# Patient Record
Sex: Female | Born: 1970 | Race: White | Hispanic: No | Marital: Married
Health system: Southern US, Community
[De-identification: ages and names within clinical notes are randomized; demographics above are authoritative.]

## PROBLEM LIST (undated history)

## (undated) DIAGNOSIS — E039 Hypothyroidism, unspecified: Secondary | ICD-10-CM

## (undated) DIAGNOSIS — G43909 Migraine, unspecified, not intractable, without status migrainosus: Secondary | ICD-10-CM

## (undated) DIAGNOSIS — R51 Headache: Secondary | ICD-10-CM

## (undated) DIAGNOSIS — D219 Benign neoplasm of connective and other soft tissue, unspecified: Secondary | ICD-10-CM

## (undated) DIAGNOSIS — L900 Lichen sclerosus et atrophicus: Secondary | ICD-10-CM

## (undated) DIAGNOSIS — Z8669 Personal history of other diseases of the nervous system and sense organs: Secondary | ICD-10-CM

## (undated) DIAGNOSIS — N938 Other specified abnormal uterine and vaginal bleeding: Secondary | ICD-10-CM

## (undated) DIAGNOSIS — N200 Calculus of kidney: Secondary | ICD-10-CM

## (undated) DIAGNOSIS — K589 Irritable bowel syndrome without diarrhea: Secondary | ICD-10-CM

## (undated) DIAGNOSIS — K219 Gastro-esophageal reflux disease without esophagitis: Secondary | ICD-10-CM

## (undated) DIAGNOSIS — N939 Abnormal uterine and vaginal bleeding, unspecified: Secondary | ICD-10-CM

## (undated) HISTORY — PX: LITHOTRIPSY: SUR834

## (undated) HISTORY — PX: CHOLECYSTECTOMY: SHX55

## (undated) HISTORY — PX: ABDOMINAL HYSTERECTOMY: SHX81

---

## 1999-07-12 ENCOUNTER — Other Ambulatory Visit: Admission: RE | Admit: 1999-07-12 | Discharge: 1999-07-12 | Payer: Self-pay | Admitting: Gynecology

## 1999-07-18 ENCOUNTER — Ambulatory Visit (HOSPITAL_COMMUNITY): Admission: RE | Admit: 1999-07-18 | Discharge: 1999-07-18 | Payer: Self-pay | Admitting: Gastroenterology

## 1999-07-18 ENCOUNTER — Encounter: Payer: Self-pay | Admitting: Gastroenterology

## 1999-07-25 ENCOUNTER — Encounter (INDEPENDENT_AMBULATORY_CARE_PROVIDER_SITE_OTHER): Payer: Self-pay | Admitting: *Deleted

## 1999-07-25 ENCOUNTER — Ambulatory Visit (HOSPITAL_COMMUNITY): Admission: RE | Admit: 1999-07-25 | Discharge: 1999-07-26 | Payer: Self-pay

## 2006-01-28 DIAGNOSIS — Z8669 Personal history of other diseases of the nervous system and sense organs: Secondary | ICD-10-CM

## 2006-01-28 HISTORY — DX: Personal history of other diseases of the nervous system and sense organs: Z86.69

## 2009-03-29 ENCOUNTER — Ambulatory Visit (HOSPITAL_BASED_OUTPATIENT_CLINIC_OR_DEPARTMENT_OTHER): Admission: RE | Admit: 2009-03-29 | Discharge: 2009-03-29 | Payer: Self-pay | Admitting: Unknown Physician Specialty

## 2009-03-29 ENCOUNTER — Ambulatory Visit: Payer: Self-pay | Admitting: Diagnostic Radiology

## 2009-10-06 ENCOUNTER — Emergency Department (HOSPITAL_BASED_OUTPATIENT_CLINIC_OR_DEPARTMENT_OTHER): Admission: EM | Admit: 2009-10-06 | Discharge: 2009-10-06 | Payer: Self-pay | Admitting: Emergency Medicine

## 2010-04-12 LAB — HIV RAPID SCREEN (BLD OR BODY FLD EXPOSURE): Rapid HIV Screen: NONREACTIVE

## 2010-04-12 LAB — HEPATITIS B SURFACE ANTIGEN: Hepatitis B Surface Ag: NEGATIVE

## 2010-04-12 LAB — HEPATITIS C ANTIBODY (REFLEX): HCV Ab: NEGATIVE

## 2010-05-07 ENCOUNTER — Other Ambulatory Visit: Payer: Self-pay | Admitting: Gynecology

## 2010-05-07 DIAGNOSIS — R922 Inconclusive mammogram: Secondary | ICD-10-CM

## 2010-05-09 ENCOUNTER — Ambulatory Visit
Admission: RE | Admit: 2010-05-09 | Discharge: 2010-05-09 | Disposition: A | Discharge status: Home / Self Care | Payer: Self-pay | Source: Ambulatory Visit | Attending: Gynecology | Admitting: Gynecology

## 2010-05-09 ENCOUNTER — Ambulatory Visit
Admission: RE | Admit: 2010-05-09 | Discharge: 2010-05-09 | Disposition: A | Discharge status: Home / Self Care | Payer: 59 | Source: Ambulatory Visit | Attending: Gynecology | Admitting: Gynecology

## 2010-05-09 DIAGNOSIS — R922 Inconclusive mammogram: Secondary | ICD-10-CM

## 2010-06-15 NOTE — Op Note (Signed)
[place]  Patient:    Rita Riley, Rita Riley                    MRN: [id number] Proc. Date: 07/25/99 Adm. Date:  [id number] Disc. Date: [id number] Attending:  Meredith Leeds CC:         Everardo All. Madilyn Fireman, M.D.                           Operative Report  PREOPERATIVE DIAGNOSES: 1. Cholelithiasis. 2. Chronic cystitis.  POSTOPERATIVE DIAGNOSES: 1. Cholelithiasis. 2. Chronic cystitis.  OPERATIONS: 1. Laparoscopic cholecystectomy. 2. Operative cholangiogram.  SURGEON:  Zigmund Daniel, M.D.  ASSISTANT:  ANESTHESIA:  General.  INDICATIONS:  The patient is a 40 year old white female with recent onset of episodic and frequent upper abdominal pain radiating to the back.  A gallbladder ultrasound had shown sludge and possibly small stones.  The common bile duct was found to be normal.  The endoscopy had shown some evidence of hyperacidity, but the patient had not improved on acid-reducing medication.  A liver test done on the day of surgery showed a slightly elevated total bilirubin of 1.6, an SGOT of 66, and an SGPT of 103.  The alkaline phosphatase was normal.  DESCRIPTION OF PROCEDURE:  After adequate anesthesia and routine preparation and draping of the abdomen, I made a short infraumbilical incision, dissected down to the fascia, opened it longitudinally, opened the peritoneum bluntly, and placed an 0 Vicryl pursestring suture.  I then put in a Hasson cannula and inflated the abdomen with CO2.  A brief laparoscopy disclosed no abnormalities.  There were just a few adhesions of the duodenum and omentum to the undersurface of the gallbladder.  The liver had a normal appearance. After placement of three additional ports and position of the patient head up, foot down, and tilted to the left, I retracted the fundus of the gallbladder upward and the infundibulum laterally and took down the adhesions.  I found the cystic artery and clipped it  with free clips and cut between the two closer to the gallbladder.  I placed a clip on the side of the cystic duct emerging from the infundibulum of the gallbladder.  I then made a small hole in the cystic duct and put in a Reddick cholangiogram catheter and secured that with a clip and performed a fluoroscopic cholangiogram.  The flow was rapid and went into the duodenum without evident obstruction or filling defect.  Despite injection under quite a bit of force, I could not totally fill out the intrahepatic radicles, but they were normal in size.  The common bile duct appeared at the upper limit of normal in size.  I reviewed the films and found no evidence of any filling defects and did not feel that any exploration of the duct was warranted.  I then put the camera back in, removed the cholangiogram catheter, and clipped the distal cystic duct with three clips and divided it at the site of the existing hole.  I then dissected the gallbladder from the liver using the cautery, getting hemostasis with clips and cautery as necessary.  I removed it intact from the liver, got good hemostasis in the gallbladder fossa, briefly irrigated the area to remove the small amount of bile and blood which was present, and removed the irrigant until it was clear.  I removed the gallbladder from the abdomen through the umbilical incision and tied  the pursestring suture.  I opened the gallbladder and found a couple of tiny stones and a lot of very thick bile.  No other abnormalities were evident.  I anesthetized all of the incisions with 0.5% Marcaine with epinephrine, removed the CO2 and all of the ports, and then closed the skin with intracuticular 4-0 Vicryl and Steri-Strips.  She tolerated the operation well. DD:  07/25/99 TD:  07/26/99 Job: [id number]

## 2010-06-15 NOTE — Op Note (Signed)
[facility]  Patient:    Rita Riley, Rita Riley                    MRN: [id number] Proc. Date: 07/18/99 Adm. Date:  [id number] Disc. Date: [id number] Attending:  Louie Bun CC:         Zigmund Daniel, M.D.                           Operative Report  PROCEDURE:  Esophagogastroduodenoscopy.  INDICATION FOR PROCEDURE:  Abdominal pain and diarrhea with equivocal findings on gallbladder ultrasound today of sludge and a possible dilated gallbladder. Procedure is to assess for significant likelihood of acid peptic disease contributing to her upper tract symptoms as opposed to biliary colic.  DESCRIPTION OF PROCEDURE:  The patient was placed in the left lateral decubitus position then placed on the pulse monitor with continue low flow oxygen delivered by nasal cannula. No sedation was used for this procedure in that we could not get easy IV access. After spraying with cetacaine, the Olympus video endoscope was advanced under direct vision into the oropharynx and esophagus. The esophagus was straight and of normal caliber at the squamocolumnar line at 37 cm above a 2 cm hiatal hernia. There was no visible esophagitis, ring, stricture or other abnormality of the gastroesophageal junction. The stomach was entered and a small amount of liquid secretions were suctioned from the fundus. Retroflexed view of the cardia was essentially unremarkable and did not clearly reveal the hiatal hernia. The fundus, body, antrum and pylorus all appeared normal. The duodenum was entered and both the bulb and second portion were well inspected and there appeared to be some erythema and granularity mainly of the duodenal bulb consistent with duodenitis but no ulcers seen. The scope was withdrawn back into the stomach and a CLOtest obtained. The scope was then withdrawn and the patient returned to the recovery room in stable condition. The patient tolerated the procedure well and  there were no immediate complications.  IMPRESSION: 1. A 2 cm hiatal hernia. 2. Duodenitis.  PLAN:  Will continue double dose Prevacid and the patient has an appointment with Dr. Lebron Conners for consideration of cholecystectomy. DD:  07/18/99 TD:  07/20/99 Job: [id number]

## 2011-03-16 ENCOUNTER — Ambulatory Visit (HOSPITAL_BASED_OUTPATIENT_CLINIC_OR_DEPARTMENT_OTHER)
Admission: RE | Admit: 2011-03-16 | Discharge: 2011-03-16 | Disposition: A | Discharge status: Home / Self Care | Payer: Managed Care, Other (non HMO) | Source: Ambulatory Visit | Attending: Family Medicine | Admitting: Family Medicine

## 2011-03-16 ENCOUNTER — Ambulatory Visit (INDEPENDENT_AMBULATORY_CARE_PROVIDER_SITE_OTHER)
Admission: RE | Admit: 2011-03-16 | Discharge: 2011-03-16 | Disposition: A | Discharge status: Home / Self Care | Payer: Managed Care, Other (non HMO) | Source: Ambulatory Visit | Attending: Family Medicine | Admitting: Family Medicine

## 2011-03-16 ENCOUNTER — Other Ambulatory Visit (HOSPITAL_BASED_OUTPATIENT_CLINIC_OR_DEPARTMENT_OTHER): Payer: Self-pay | Admitting: Family Medicine

## 2011-03-16 DIAGNOSIS — S82899A Other fracture of unspecified lower leg, initial encounter for closed fracture: Secondary | ICD-10-CM | POA: Insufficient documentation

## 2011-03-16 DIAGNOSIS — X58XXXA Exposure to other specified factors, initial encounter: Secondary | ICD-10-CM | POA: Insufficient documentation

## 2011-03-16 DIAGNOSIS — W19XXXA Unspecified fall, initial encounter: Secondary | ICD-10-CM

## 2011-03-16 DIAGNOSIS — M25579 Pain in unspecified ankle and joints of unspecified foot: Secondary | ICD-10-CM | POA: Insufficient documentation

## 2011-03-16 DIAGNOSIS — S8263XA Displaced fracture of lateral malleolus of unspecified fibula, initial encounter for closed fracture: Secondary | ICD-10-CM

## 2011-07-30 ENCOUNTER — Other Ambulatory Visit: Payer: Self-pay | Admitting: Gynecology

## 2012-07-23 ENCOUNTER — Other Ambulatory Visit: Payer: Self-pay

## 2012-07-23 DIAGNOSIS — Z1231 Encounter for screening mammogram for malignant neoplasm of breast: Secondary | ICD-10-CM

## 2012-08-26 ENCOUNTER — Ambulatory Visit
Admission: RE | Admit: 2012-08-26 | Discharge: 2012-08-26 | Disposition: A | Discharge status: Home / Self Care | Payer: Managed Care, Other (non HMO) | Source: Ambulatory Visit

## 2012-08-26 DIAGNOSIS — Z1231 Encounter for screening mammogram for malignant neoplasm of breast: Secondary | ICD-10-CM

## 2013-01-28 HISTORY — PX: HYSTERECTOMY ABDOMINAL WITH SALPINGECTOMY: SHX6725

## 2013-08-16 ENCOUNTER — Encounter (HOSPITAL_COMMUNITY): Payer: Self-pay | Admitting: Pharmacy Technician

## 2013-08-24 ENCOUNTER — Other Ambulatory Visit (HOSPITAL_COMMUNITY): Payer: Managed Care, Other (non HMO)

## 2013-08-24 ENCOUNTER — Encounter (HOSPITAL_COMMUNITY): Payer: Self-pay

## 2013-08-24 ENCOUNTER — Encounter (HOSPITAL_COMMUNITY)
Admission: RE | Admit: 2013-08-24 | Discharge: 2013-08-24 | Disposition: A | Discharge status: Home / Self Care | Payer: Managed Care, Other (non HMO) | Source: Ambulatory Visit | Attending: Obstetrics and Gynecology | Admitting: Obstetrics and Gynecology

## 2013-08-24 DIAGNOSIS — Z01812 Encounter for preprocedural laboratory examination: Secondary | ICD-10-CM | POA: Insufficient documentation

## 2013-08-24 DIAGNOSIS — Z01818 Encounter for other preprocedural examination: Secondary | ICD-10-CM | POA: Insufficient documentation

## 2013-08-24 HISTORY — DX: Calculus of kidney: N20.0

## 2013-08-24 HISTORY — DX: Headache: R51

## 2013-08-24 HISTORY — DX: Irritable bowel syndrome without diarrhea: K58.9

## 2013-08-24 HISTORY — DX: Gastro-esophageal reflux disease without esophagitis: K21.9

## 2013-08-24 HISTORY — DX: Hypothyroidism, unspecified: E03.9

## 2013-08-24 LAB — CBC
HCT: 43.3 % (ref 36.0–46.0)
Hemoglobin: 14.5 g/dL (ref 12.0–15.0)
MCH: 32.2 pg (ref 26.0–34.0)
MCHC: 33.5 g/dL (ref 30.0–36.0)
MCV: 96 fL (ref 78.0–100.0)
PLATELETS: 353 10*3/uL (ref 150–400)
RBC: 4.51 MIL/uL (ref 3.87–5.11)
RDW: 12.5 % (ref 11.5–15.5)
WBC: 11.5 10*3/uL — ABNORMAL HIGH (ref 4.0–10.5)

## 2013-08-24 NOTE — Patient Instructions (Signed)
[place]  08/24/2013   Your procedure is scheduled on:  09/01/13  Enter through the Main Entrance of [facility] [place] up the phone at the desk and dial [phone].   Call this number if you have problems the morning of surgery: [phone]   Remember:   Do not eat food:After Midnight.  Do not drink clear liquids: After Midnight.  Take these medicines the morning of surgery with A SIP OF WATER: NA   Do not wear jewelry, make-up or nail polish.  Do not wear lotions, powders, or perfumes. You may wear deodorant.  Do not shave 48 hours prior to surgery.  Do not bring valuables to the hospital.  [facility] is not   responsible for any belongings or valuables brought to the hospital.  Contacts, dentures or bridgework may not be worn into surgery.  Leave suitcase in the car. After surgery it may be brought to your room.  For patients admitted to the hospital, checkout time is 11:00 AM the day of              discharge.   Patients discharged the day of surgery will not be allowed to drive             home.  Name and phone number of your driver: NA  Special Instructions:      Please read over the following fact sheets that you were given:   Surgical Site Infection Prevention

## 2013-08-31 ENCOUNTER — Encounter (HOSPITAL_COMMUNITY): Payer: Self-pay | Admitting: Obstetrics and Gynecology

## 2013-08-31 DIAGNOSIS — D219 Benign neoplasm of connective and other soft tissue, unspecified: Secondary | ICD-10-CM

## 2013-08-31 DIAGNOSIS — N939 Abnormal uterine and vaginal bleeding, unspecified: Secondary | ICD-10-CM | POA: Diagnosis present

## 2013-08-31 DIAGNOSIS — N938 Other specified abnormal uterine and vaginal bleeding: Secondary | ICD-10-CM

## 2013-08-31 HISTORY — DX: Abnormal uterine and vaginal bleeding, unspecified: N93.9

## 2013-08-31 HISTORY — DX: Benign neoplasm of connective and other soft tissue, unspecified: D21.9

## 2013-08-31 HISTORY — DX: Other specified abnormal uterine and vaginal bleeding: N93.8

## 2013-08-31 MED ORDER — DEXTROSE 5 % IV SOLN
3.0000 g | INTRAVENOUS | Status: AC
  Administered 2013-09-01: 3 g via INTRAVENOUS
  Filled 2013-08-31: qty 3000

## 2013-08-31 NOTE — H&P (Signed)
Rita Riley is an 43 y.o. female G0P0 with AUB, DUB and fibroids for LAVH, B salpingectomy.  D/W pt r/b/a of of surgery.  EMB benign.  US shows small fibroids which may impinge of endometrial lining.    Pertinent Gynecological History: Sexually transmitted diseases:none No abn pap OB History: G0, P0   Menstrual History:  No LMP recorded.    Past Medical History  Diagnosis Date  . Hypothyroidism   . GERD (gastroesophageal reflux disease)   . Recurrent kidney stones     several surgeries  . IBS (irritable bowel syndrome)   . Headache(784.0)     takes Verapamil ONLY for migraines  . Fibroids 08/31/2013  . Abnormal uterine bleeding (AUB) 08/31/2013  . DUB (dysfunctional uterine bleeding) 08/31/2013    Past Surgical History  Procedure Laterality Date  . Lithotripsy     FH: Breast cancer, hypothyroid, Diabetes.  Social History:  has no tobacco, alcohol, and drug history on file. married  Allergies:  Allergies  Allergen Reactions  . Sulfa Antibiotics Rash    Levothyroxine, verapamil, Vitamin D, Zantac    Review of Systems  Constitutional: Negative.   HENT: Negative.   Eyes: Negative.   Respiratory: Negative.   Cardiovascular: Negative.   Gastrointestinal: Negative.   Genitourinary: Negative.   Musculoskeletal: Negative.   Skin: Negative.   Neurological: Negative.   Psychiatric/Behavioral: Negative.     There were no vitals taken for this visit. Physical Exam  Constitutional: She is oriented to person, place, and time. She appears well-developed and well-nourished.  HENT:  Head: Normocephalic and atraumatic.  Cardiovascular: Normal rate and regular rhythm.   Respiratory: Effort normal and breath sounds normal. No respiratory distress. She has no wheezes.  GI: Soft. Bowel sounds are normal. She exhibits no distension. There is no tenderness.  Musculoskeletal: Normal range of motion.  Neurological: She is alert and oriented to person, place, and time.  Skin:  Skin is warm and dry.  Psychiatric: She has a normal mood and affect. Her behavior is normal.    No results found for this or any previous visit (from the past 24 hour(s)). Ur Cx neg  Assessment/Plan: 43yo G0P0 for LAVH secondary to DUB, AUB, fibroids D/w pt r/b/a of surgery, wishes to proceed Ancef for prophylaxis PCA for pain postoperative;y  Bovard-Stuckert, Rita Riley 08/31/2013, 8:41 PM

## 2013-09-01 ENCOUNTER — Observation Stay (HOSPITAL_COMMUNITY)
Admission: RE | Admit: 2013-09-01 | Discharge: 2013-09-02 | Disposition: A | Discharge status: Home / Self Care | Payer: Managed Care, Other (non HMO) | Source: Ambulatory Visit | Attending: Obstetrics and Gynecology | Admitting: Obstetrics and Gynecology

## 2013-09-01 ENCOUNTER — Encounter (HOSPITAL_COMMUNITY): Payer: Managed Care, Other (non HMO) | Admitting: Anesthesiology

## 2013-09-01 ENCOUNTER — Encounter (HOSPITAL_COMMUNITY): Payer: Self-pay | Admitting: *Deleted

## 2013-09-01 ENCOUNTER — Ambulatory Visit (HOSPITAL_COMMUNITY): Payer: Managed Care, Other (non HMO) | Admitting: Anesthesiology

## 2013-09-01 ENCOUNTER — Encounter (HOSPITAL_COMMUNITY)
Admission: RE | Disposition: A | Discharge status: Home / Self Care | Payer: Self-pay | Source: Ambulatory Visit | Attending: Obstetrics and Gynecology

## 2013-09-01 DIAGNOSIS — N8000 Endometriosis of the uterus, unspecified: Secondary | ICD-10-CM | POA: Insufficient documentation

## 2013-09-01 DIAGNOSIS — Z803 Family history of malignant neoplasm of breast: Secondary | ICD-10-CM | POA: Diagnosis not present

## 2013-09-01 DIAGNOSIS — D252 Subserosal leiomyoma of uterus: Secondary | ICD-10-CM | POA: Diagnosis not present

## 2013-09-01 DIAGNOSIS — N949 Unspecified condition associated with female genital organs and menstrual cycle: Secondary | ICD-10-CM | POA: Diagnosis present

## 2013-09-01 DIAGNOSIS — D219 Benign neoplasm of connective and other soft tissue, unspecified: Secondary | ICD-10-CM | POA: Diagnosis present

## 2013-09-01 DIAGNOSIS — Z6841 Body Mass Index (BMI) 40.0 and over, adult: Secondary | ICD-10-CM | POA: Diagnosis not present

## 2013-09-01 DIAGNOSIS — Z90710 Acquired absence of both cervix and uterus: Secondary | ICD-10-CM | POA: Diagnosis present

## 2013-09-01 DIAGNOSIS — D251 Intramural leiomyoma of uterus: Secondary | ICD-10-CM | POA: Diagnosis not present

## 2013-09-01 DIAGNOSIS — N938 Other specified abnormal uterine and vaginal bleeding: Principal | ICD-10-CM | POA: Diagnosis present

## 2013-09-01 DIAGNOSIS — K589 Irritable bowel syndrome without diarrhea: Secondary | ICD-10-CM | POA: Insufficient documentation

## 2013-09-01 DIAGNOSIS — E039 Hypothyroidism, unspecified: Secondary | ICD-10-CM | POA: Insufficient documentation

## 2013-09-01 DIAGNOSIS — N925 Other specified irregular menstruation: Secondary | ICD-10-CM | POA: Diagnosis not present

## 2013-09-01 DIAGNOSIS — K219 Gastro-esophageal reflux disease without esophagitis: Secondary | ICD-10-CM | POA: Insufficient documentation

## 2013-09-01 DIAGNOSIS — Z833 Family history of diabetes mellitus: Secondary | ICD-10-CM | POA: Diagnosis not present

## 2013-09-01 DIAGNOSIS — R51 Headache: Secondary | ICD-10-CM | POA: Insufficient documentation

## 2013-09-01 DIAGNOSIS — N939 Abnormal uterine and vaginal bleeding, unspecified: Secondary | ICD-10-CM | POA: Diagnosis present

## 2013-09-01 HISTORY — DX: Benign neoplasm of connective and other soft tissue, unspecified: D21.9

## 2013-09-01 HISTORY — PX: LAPAROSCOPIC ASSISTED VAGINAL HYSTERECTOMY: SHX5398

## 2013-09-01 HISTORY — DX: Abnormal uterine and vaginal bleeding, unspecified: N93.9

## 2013-09-01 HISTORY — DX: Personal history of other diseases of the nervous system and sense organs: Z86.69

## 2013-09-01 HISTORY — DX: Other specified abnormal uterine and vaginal bleeding: N93.8

## 2013-09-01 LAB — COMPREHENSIVE METABOLIC PANEL
ALT: 35 U/L (ref 0–35)
AST: 31 U/L (ref 0–37)
Albumin: 3.4 g/dL — ABNORMAL LOW (ref 3.5–5.2)
Alkaline Phosphatase: 114 U/L (ref 39–117)
Anion gap: 11 (ref 5–15)
BUN: 13 mg/dL (ref 6–23)
CO2: 25 meq/L (ref 19–32)
CREATININE: 0.94 mg/dL (ref 0.50–1.10)
Calcium: 9 mg/dL (ref 8.4–10.5)
Chloride: 103 mEq/L (ref 96–112)
GFR calc Af Amer: 84 mL/min — ABNORMAL LOW (ref >90)
GFR, EST NON AFRICAN AMERICAN: 73 mL/min — AB (ref >90)
Glucose, Bld: 108 mg/dL — ABNORMAL HIGH (ref 70–99)
Potassium: 4.7 mEq/L (ref 3.7–5.3)
SODIUM: 139 meq/L (ref 137–147)
Total Bilirubin: 0.4 mg/dL (ref 0.3–1.2)
Total Protein: 7 g/dL (ref 6.0–8.3)

## 2013-09-01 LAB — PREGNANCY, URINE: PREG TEST UR: NEGATIVE

## 2013-09-01 SURGERY — HYSTERECTOMY, VAGINAL, LAPAROSCOPY-ASSISTED
Anesthesia: General | Site: Abdomen | Laterality: Bilateral

## 2013-09-01 MED ORDER — HYDROMORPHONE HCL PF 1 MG/ML IJ SOLN
0.2500 mg | INTRAMUSCULAR | Status: DC | PRN
  Administered 2013-09-01 (×4): 0.5 mg via INTRAVENOUS

## 2013-09-01 MED ORDER — LACTATED RINGERS IV SOLN
INTRAVENOUS | Status: DC
Start: 2013-09-01 — End: 2013-09-02
  Administered 2013-09-01 – 2013-09-02 (×3): via INTRAVENOUS

## 2013-09-01 MED ORDER — VERAPAMIL HCL ER 300 MG PO CP24: 1.0000 | ORAL_CAPSULE | Freq: Every day | ORAL | Status: DC

## 2013-09-01 MED ORDER — GLYCOPYRROLATE 0.2 MG/ML IJ SOLN
INTRAMUSCULAR | Status: AC
  Filled 2013-09-01: qty 4

## 2013-09-01 MED ORDER — MIDAZOLAM HCL 5 MG/5ML IJ SOLN
INTRAMUSCULAR | Status: DC | PRN
  Administered 2013-09-01: 2 mg via INTRAVENOUS

## 2013-09-01 MED ORDER — NEOSTIGMINE METHYLSULFATE 10 MG/10ML IV SOLN
INTRAVENOUS | Status: DC | PRN
  Administered 2013-09-01: 2.5 mg via INTRAVENOUS

## 2013-09-01 MED ORDER — SODIUM CHLORIDE 0.9 % IJ SOLN
INTRAMUSCULAR | Status: AC
  Filled 2013-09-01: qty 100

## 2013-09-01 MED ORDER — MEPERIDINE HCL 25 MG/ML IJ SOLN
INTRAMUSCULAR | Status: AC
  Administered 2013-09-01: 6.25 mg via INTRAVENOUS
  Filled 2013-09-01: qty 1

## 2013-09-01 MED ORDER — ONDANSETRON HCL 4 MG/2ML IJ SOLN: 4.0000 mg | Freq: Four times a day (QID) | INTRAMUSCULAR | Status: DC | PRN

## 2013-09-01 MED ORDER — LIDOCAINE HCL (CARDIAC) 20 MG/ML IV SOLN
INTRAVENOUS | Status: AC
  Filled 2013-09-01: qty 5

## 2013-09-01 MED ORDER — SCOPOLAMINE 1 MG/3DAYS TD PT72
MEDICATED_PATCH | TRANSDERMAL | Status: AC
  Administered 2013-09-01: 1.5 mg
  Filled 2013-09-01: qty 1

## 2013-09-01 MED ORDER — HYDROMORPHONE 0.3 MG/ML IV SOLN
INTRAVENOUS | Status: DC
  Administered 2013-09-01: 2.19 mg via INTRAVENOUS
  Administered 2013-09-01: 15:00:00 via INTRAVENOUS
  Administered 2013-09-01: 0.799 mg via INTRAVENOUS
  Administered 2013-09-02: 0.599 mg via INTRAVENOUS
  Administered 2013-09-02: 0.999 mg via INTRAVENOUS
  Filled 2013-09-01: qty 25

## 2013-09-01 MED ORDER — OXYCODONE-ACETAMINOPHEN 5-325 MG PO TABS
1.0000 | ORAL_TABLET | ORAL | Status: DC | PRN
  Administered 2013-09-02 (×2): 2 via ORAL
  Filled 2013-09-01 (×2): qty 2

## 2013-09-01 MED ORDER — FENTANYL CITRATE 0.05 MG/ML IJ SOLN
INTRAMUSCULAR | Status: AC
  Filled 2013-09-01: qty 5

## 2013-09-01 MED ORDER — KETOROLAC TROMETHAMINE 30 MG/ML IJ SOLN: 15.0000 mg | Freq: Once | INTRAMUSCULAR | Status: DC | PRN

## 2013-09-01 MED ORDER — ROCURONIUM BROMIDE 100 MG/10ML IV SOLN
INTRAVENOUS | Status: DC | PRN
Start: 2013-09-01 — End: 2013-09-01
  Administered 2013-09-01: 5 mg via INTRAVENOUS
  Administered 2013-09-01: 10 mg via INTRAVENOUS
  Administered 2013-09-01: 50 mg via INTRAVENOUS
  Administered 2013-09-01: 10 mg via INTRAVENOUS

## 2013-09-01 MED ORDER — LACTATED RINGERS IV BOLUS (SEPSIS)
500.0000 mL | Freq: Once | INTRAVENOUS | Status: AC
Start: 2013-09-01 — End: 2013-09-01
  Administered 2013-09-01: 500 mL via INTRAVENOUS

## 2013-09-01 MED ORDER — PANTOPRAZOLE SODIUM 40 MG PO TBEC: 40.0000 mg | DELAYED_RELEASE_TABLET | Freq: Every day | ORAL | Status: DC

## 2013-09-01 MED ORDER — MEPERIDINE HCL 25 MG/ML IJ SOLN
6.2500 mg | INTRAMUSCULAR | Status: DC | PRN
  Administered 2013-09-01 (×2): 6.25 mg via INTRAVENOUS

## 2013-09-01 MED ORDER — KETOROLAC TROMETHAMINE 30 MG/ML IJ SOLN
INTRAMUSCULAR | Status: AC
  Filled 2013-09-01: qty 1

## 2013-09-01 MED ORDER — GUAIFENESIN 100 MG/5ML PO SOLN: 15.0000 mL | ORAL | Status: DC | PRN

## 2013-09-01 MED ORDER — ACETAMINOPHEN 160 MG/5ML PO SOLN
ORAL | Status: AC
  Administered 2013-09-01: 650 mg via ORAL
  Filled 2013-09-01: qty 20.3

## 2013-09-01 MED ORDER — MENTHOL 3 MG MT LOZG: 1.0000 | LOZENGE | OROMUCOSAL | Status: DC | PRN

## 2013-09-01 MED ORDER — BUPIVACAINE HCL (PF) 0.25 % IJ SOLN
INTRAMUSCULAR | Status: AC
  Filled 2013-09-01: qty 30

## 2013-09-01 MED ORDER — LACTATED RINGERS IV SOLN
INTRAVENOUS | Status: DC
  Administered 2013-09-01 (×3): via INTRAVENOUS

## 2013-09-01 MED ORDER — HYDROMORPHONE HCL PF 1 MG/ML IJ SOLN
INTRAMUSCULAR | Status: AC
  Administered 2013-09-01: 0.5 mg via INTRAVENOUS
  Filled 2013-09-01: qty 1

## 2013-09-01 MED ORDER — ACETAMINOPHEN 160 MG/5ML PO SOLN
650.0000 mg | Freq: Four times a day (QID) | ORAL | Status: DC | PRN
  Administered 2013-09-01: 650 mg via ORAL

## 2013-09-01 MED ORDER — DEXAMETHASONE SODIUM PHOSPHATE 10 MG/ML IJ SOLN
INTRAMUSCULAR | Status: DC | PRN
  Administered 2013-09-01: 4 mg via INTRAVENOUS

## 2013-09-01 MED ORDER — LEVOTHYROXINE SODIUM 175 MCG PO TABS: 262.5000 ug | ORAL_TABLET | ORAL | Status: DC

## 2013-09-01 MED ORDER — PROPOFOL 10 MG/ML IV EMUL
INTRAVENOUS | Status: AC
  Filled 2013-09-01: qty 20

## 2013-09-01 MED ORDER — NEOSTIGMINE METHYLSULFATE 10 MG/10ML IV SOLN
INTRAVENOUS | Status: AC
  Filled 2013-09-01: qty 1

## 2013-09-01 MED ORDER — PROPOFOL 10 MG/ML IV BOLUS
INTRAVENOUS | Status: DC | PRN
Start: 2013-09-01 — End: 2013-09-01
  Administered 2013-09-01: 150 mg via INTRAVENOUS

## 2013-09-01 MED ORDER — LIDOCAINE HCL (CARDIAC) 20 MG/ML IV SOLN
INTRAVENOUS | Status: DC | PRN
  Administered 2013-09-01: 60 mg via INTRAVENOUS

## 2013-09-01 MED ORDER — SIMETHICONE 80 MG PO CHEW: 80.0000 mg | CHEWABLE_TABLET | Freq: Four times a day (QID) | ORAL | Status: DC | PRN

## 2013-09-01 MED ORDER — KETOROLAC TROMETHAMINE 30 MG/ML IJ SOLN
INTRAMUSCULAR | Status: DC | PRN
  Administered 2013-09-01: 30 mg via INTRAVENOUS

## 2013-09-01 MED ORDER — NALOXONE HCL 0.4 MG/ML IJ SOLN: 0.4000 mg | INTRAMUSCULAR | Status: DC | PRN

## 2013-09-01 MED ORDER — BUPIVACAINE HCL (PF) 0.25 % IJ SOLN
INTRAMUSCULAR | Status: DC | PRN
  Administered 2013-09-01: 9 mL

## 2013-09-01 MED ORDER — HYDROCORTISONE 1 % EX CREA
1.0000 | TOPICAL_CREAM | Freq: Two times a day (BID) | CUTANEOUS | Status: DC | PRN
Start: 2013-09-01 — End: 2013-09-02
  Administered 2013-09-01: 1 via TOPICAL
  Filled 2013-09-01: qty 28

## 2013-09-01 MED ORDER — IBUPROFEN 800 MG PO TABS: 800.0000 mg | ORAL_TABLET | Freq: Three times a day (TID) | ORAL | Status: DC | PRN

## 2013-09-01 MED ORDER — MIDAZOLAM HCL 2 MG/2ML IJ SOLN
INTRAMUSCULAR | Status: AC
Start: 2013-09-01 — End: 2013-09-01
  Filled 2013-09-01: qty 2

## 2013-09-01 MED ORDER — LEVOTHYROXINE SODIUM 175 MCG PO TABS
175.0000 ug | ORAL_TABLET | ORAL | Status: DC
  Administered 2013-09-02: 175 ug via ORAL
  Filled 2013-09-01: qty 1

## 2013-09-01 MED ORDER — ALUM & MAG HYDROXIDE-SIMETH 200-200-20 MG/5ML PO SUSP: 30.0000 mL | ORAL | Status: DC | PRN

## 2013-09-01 MED ORDER — ONDANSETRON HCL 4 MG/2ML IJ SOLN
INTRAMUSCULAR | Status: DC | PRN
  Administered 2013-09-01: 4 mg via INTRAVENOUS

## 2013-09-01 MED ORDER — VERAPAMIL HCL ER 300 MG PO CP24
1.0000 | ORAL_CAPSULE | Freq: Every day | ORAL | Status: DC
  Administered 2013-09-01: 300 mg via ORAL
  Filled 2013-09-01: qty 1

## 2013-09-01 MED ORDER — PROMETHAZINE HCL 25 MG/ML IJ SOLN: 6.2500 mg | INTRAMUSCULAR | Status: DC | PRN

## 2013-09-01 MED ORDER — SODIUM CHLORIDE 0.9 % IJ SOLN: 9.0000 mL | INTRAMUSCULAR | Status: DC | PRN

## 2013-09-01 MED ORDER — DIPHENHYDRAMINE HCL 50 MG/ML IJ SOLN
12.5000 mg | Freq: Four times a day (QID) | INTRAMUSCULAR | Status: DC | PRN
  Administered 2013-09-01: 12.5 mg via INTRAVENOUS
  Filled 2013-09-01: qty 1

## 2013-09-01 MED ORDER — DEXAMETHASONE SODIUM PHOSPHATE 10 MG/ML IJ SOLN
INTRAMUSCULAR | Status: AC
  Filled 2013-09-01: qty 1

## 2013-09-01 MED ORDER — DIPHENHYDRAMINE HCL 12.5 MG/5ML PO ELIX: 12.5000 mg | ORAL_SOLUTION | Freq: Four times a day (QID) | ORAL | Status: DC | PRN

## 2013-09-01 MED ORDER — ONDANSETRON HCL 4 MG PO TABS: 4.0000 mg | ORAL_TABLET | Freq: Four times a day (QID) | ORAL | Status: DC | PRN

## 2013-09-01 MED ORDER — GLYCOPYRROLATE 0.2 MG/ML IJ SOLN
INTRAMUSCULAR | Status: DC | PRN
  Administered 2013-09-01: .5 mg via INTRAVENOUS

## 2013-09-01 MED ORDER — SODIUM CHLORIDE 0.9 % IV SOLN
INTRAVENOUS | Status: DC | PRN
  Administered 2013-09-01: 12:00:00 via INTRAMUSCULAR

## 2013-09-01 MED ORDER — ONDANSETRON HCL 4 MG/2ML IJ SOLN
INTRAMUSCULAR | Status: AC
  Filled 2013-09-01: qty 2

## 2013-09-01 MED ORDER — FENTANYL CITRATE 0.05 MG/ML IJ SOLN
INTRAMUSCULAR | Status: DC | PRN
  Administered 2013-09-01 (×2): 50 ug via INTRAVENOUS
  Administered 2013-09-01: 100 ug via INTRAVENOUS
  Administered 2013-09-01: 50 ug via INTRAVENOUS
  Administered 2013-09-01 (×2): 100 ug via INTRAVENOUS
  Administered 2013-09-01: 50 ug via INTRAVENOUS

## 2013-09-01 MED ORDER — VASOPRESSIN 20 UNIT/ML IJ SOLN
INTRAMUSCULAR | Status: AC
  Filled 2013-09-01: qty 1

## 2013-09-01 SURGICAL SUPPLY — 41 items
BLADE SURG 10 STRL SS (BLADE) ×3 IMPLANT
BLADE SURG 11 STRL SS (BLADE) ×6 IMPLANT
CABLE HIGH FREQUENCY MONO STRZ (ELECTRODE) IMPLANT
CLOSURE WOUND 1/4 X3 (GAUZE/BANDAGES/DRESSINGS)
CLOTH BEACON ORANGE TIMEOUT ST (SAFETY) ×3 IMPLANT
CONT PATH 16OZ SNAP LID 3702 (MISCELLANEOUS) ×3 IMPLANT
COVER TABLE BACK 60X90 (DRAPES) ×3 IMPLANT
DECANTER SPIKE VIAL GLASS SM (MISCELLANEOUS) ×2 IMPLANT
DRSG COVADERM PLUS 2X2 (GAUZE/BANDAGES/DRESSINGS) ×6 IMPLANT
DRSG OPSITE POSTOP 3X4 (GAUZE/BANDAGES/DRESSINGS) ×2 IMPLANT
DURAPREP 26ML APPLICATOR (WOUND CARE) ×3 IMPLANT
ELECT REM PT RETURN 9FT ADLT (ELECTROSURGICAL)
ELECTRODE REM PT RTRN 9FT ADLT (ELECTROSURGICAL) IMPLANT
EVACUATOR PREFILTER SMOKE (MISCELLANEOUS) ×3 IMPLANT
GLOVE BIO SURGEON STRL SZ 6.5 (GLOVE) ×2 IMPLANT
GLOVE BIO SURGEON STRL SZ7 (GLOVE) ×3 IMPLANT
GLOVE BIO SURGEONS STRL SZ 6.5 (GLOVE) ×1
GLOVE BIOGEL PI IND STRL 7.0 (GLOVE) ×2 IMPLANT
GLOVE BIOGEL PI INDICATOR 7.0 (GLOVE) ×4
GOWN STRL REUS W/ TWL LRG LVL3 (GOWN DISPOSABLE) ×7 IMPLANT
GOWN STRL REUS W/TWL LRG LVL3 (GOWN DISPOSABLE) ×21
NEEDLE INSUFFLATION 120MM (ENDOMECHANICALS) ×3 IMPLANT
NS IRRIG 1000ML POUR BTL (IV SOLUTION) ×3 IMPLANT
PACK LAVH (CUSTOM PROCEDURE TRAY) ×3 IMPLANT
PROTECTOR NERVE ULNAR (MISCELLANEOUS) ×5 IMPLANT
SET CYSTO W/LG BORE CLAMP LF (SET/KITS/TRAYS/PACK) IMPLANT
SET IRRIG TUBING LAPAROSCOPIC (IRRIGATION / IRRIGATOR) ×2 IMPLANT
SHEARS HARMONIC ACE PLUS 36CM (ENDOMECHANICALS) ×3 IMPLANT
STRIP CLOSURE SKIN 1/4X3 (GAUZE/BANDAGES/DRESSINGS) IMPLANT
SUT VIC AB 1 CT1 18XBRD ANBCTR (SUTURE) ×2 IMPLANT
SUT VIC AB 1 CT1 8-18 (SUTURE) ×6
SUT VIC AB 2-0 CT1 (SUTURE) ×3 IMPLANT
SUT VICRYL 0 TIES 12 18 (SUTURE) ×3 IMPLANT
SUT VICRYL 0 UR6 27IN ABS (SUTURE) IMPLANT
SUT VICRYL 4-0 PS2 18IN ABS (SUTURE) ×3 IMPLANT
TOWEL OR 17X24 6PK STRL BLUE (TOWEL DISPOSABLE) ×6 IMPLANT
TRAY FOLEY CATH 14FR (SET/KITS/TRAYS/PACK) ×3 IMPLANT
TROCAR 5M 150ML BLDLS (TROCAR) ×2 IMPLANT
TROCAR XCEL NON-BLD 5MMX100MML (ENDOMECHANICALS) ×9 IMPLANT
WARMER LAPAROSCOPE (MISCELLANEOUS) ×3 IMPLANT
WATER STERILE IRR 1000ML POUR (IV SOLUTION) ×3 IMPLANT

## 2013-09-01 NOTE — Anesthesia Postprocedure Evaluation (Signed)
  Anesthesia Post Note  Patient: Rita Riley  Procedure(s) Performed: Procedure(s) (LRB): LAPAROSCOPIC ASSISTED VAGINAL HYSTERECTOMY, Bilateral Salpingectomy (Bilateral)  Anesthesia type: GA  Patient location: PACU  Post pain: Pain level controlled  Post assessment: Post-op Vital signs reviewed  Last Vitals:  Filed Vitals:   09/01/13 0843  BP: 96/75  Pulse: 89  Temp: 36.6 C  Resp: 18    Post vital signs: Reviewed  Level of consciousness: sedated  Complications: No apparent anesthesia complications

## 2013-09-01 NOTE — Anesthesia Preprocedure Evaluation (Signed)
Anesthesia Evaluation  Patient identified by MRN, date of birth, ID band  Reviewed: Allergy & Precautions, H&P , NPO status , Patient's Chart, lab work & pertinent test results  Airway Mallampati: I TM Distance: >3 FB Neck ROM: full    Dental no notable dental hx. (+) Teeth Intact   Pulmonary neg pulmonary ROS,          Cardiovascular negative cardio ROS      Neuro/Psych negative psych ROS   GI/Hepatic Neg liver ROS, GERD-  Medicated,  Endo/Other  Hypothyroidism Morbid obesity  Renal/GU      Musculoskeletal   Abdominal (+) + obese,   Peds  Hematology negative hematology ROS (+)   Anesthesia Other Findings   Reproductive/Obstetrics negative OB ROS                           Anesthesia Physical Anesthesia Plan  ASA: III  Anesthesia Plan: General   Post-op Pain Management:    Induction: Intravenous  Airway Management Planned: Oral ETT  Additional Equipment:   Intra-op Plan:   Post-operative Plan: Extubation in OR  Informed Consent: I have reviewed the patients History and Physical, chart, labs and discussed the procedure including the risks, benefits and alternatives for the proposed anesthesia with the patient or authorized representative who has indicated his/her understanding and acceptance.   Dental Advisory Given  Plan Discussed with: CRNA and Surgeon  Anesthesia Plan Comments:         Anesthesia Quick Evaluation

## 2013-09-01 NOTE — Transfer of Care (Signed)
Immediate Anesthesia Transfer of Care Note  Patient: Rita Riley  Procedure(s) Performed: Procedure(s): LAPAROSCOPIC ASSISTED VAGINAL HYSTERECTOMY, Bilateral Salpingectomy (Bilateral)  Patient Location: PACU  Anesthesia Type:General  Level of Consciousness: awake  Airway & Oxygen Therapy: Patient Spontanous Breathing and Patient connected to nasal cannula oxygen  Post-op Assessment: Report given to PACU RN and Post -op Vital signs reviewed and stable  Post vital signs: stable  Complications: none

## 2013-09-01 NOTE — Interval H&P Note (Signed)
History and Physical Interval Note:  09/01/2013 9:48 AM  Rita Riley  has presented today for surgery, with the diagnosis of Dysfuctional Uterine Bleeding,  The various methods of treatment have been discussed with the patient and family. After consideration of risks, benefits and other options for treatment, the patient has consented to  Procedure(s): LAPAROSCOPIC ASSISTED VAGINAL HYSTERECTOMY, Bilateral Salpingectomy (Bilateral) as a surgical intervention .  The patient's history has been reviewed, patient examined, no change in status, stable for surgery.  I have reviewed the patient's chart and labs.  Questions were answered to the patient's satisfaction.     Bovard-Stuckert, Dandre Sisler

## 2013-09-01 NOTE — Brief Op Note (Signed)
09/01/2013  12:30 PM  PATIENT:  Rita Riley  43 y.o. female  PRE-OPERATIVE DIAGNOSIS:  Dysfuctional Uterine Bleeding,  POST-OPERATIVE DIAGNOSIS:  Dysfuctional Uterine Bleeding,  PROCEDURE:  Procedure(s): LAPAROSCOPIC ASSISTED VAGINAL HYSTERECTOMY, Bilateral Salpingectomy (Bilateral)  SURGEON:  Surgeon(s) and Role:    * Janyth Contes, MD - Primary    * Cheri Fowler, MD - Assisting  ANESTHESIA:   local and general  EBL:  Total I/O In: 2000 [I.V.:2000] Out: 200 [Urine:125; Blood:75]  BLOOD ADMINISTERED:none  DRAINS: Urinary Catheter (Foley)   LOCAL MEDICATIONS USED:  MARCAINE     SPECIMEN:  Source of Specimen:  uterus, cervix and B fallopian tubes  DISPOSITION OF SPECIMEN:  PATHOLOGY  COUNTS:  YES  TOURNIQUET:  * No tourniquets in log *  DICTATION: Other Dictation: Dictation Number [id number]  PLAN OF CARE: Admit for overnight observation  PATIENT DISPOSITION:  PACU - hemodynamically stable.   Delay start of Pharmacological VTE agent (>24hrs) due to surgical blood loss or risk of bleeding: not applicable

## 2013-09-01 NOTE — Progress Notes (Addendum)
Day of Surgery Procedure(s) (LRB): LAPAROSCOPIC ASSISTED VAGINAL HYSTERECTOMY, Bilateral Salpingectomy (Bilateral)  Subjective: Patient reports tolerating PO.  She is having no nausea and pain controlled.  IV fell out and she is having replaced now.   Objective: I have reviewed patient's vital signs and intake and output.  Awake and alert Abdomen soft and NT  Assessment: s/p Procedure(s): LAPAROSCOPIC ASSISTED VAGINAL HYSTERECTOMY, Bilateral Salpingectomy (Bilateral): stable  Plan: Will advance diet as tolerated Restarting IV as does not feel ready for po meds Urine a bit concentrated, may need bolus after IV restarted, adequate but barely  LOS: 0 days    Rita Riley W 09/01/2013, 6:21 PM

## 2013-09-02 ENCOUNTER — Encounter (HOSPITAL_COMMUNITY): Payer: Self-pay | Admitting: Obstetrics and Gynecology

## 2013-09-02 DIAGNOSIS — N949 Unspecified condition associated with female genital organs and menstrual cycle: Secondary | ICD-10-CM | POA: Diagnosis not present

## 2013-09-02 DIAGNOSIS — N938 Other specified abnormal uterine and vaginal bleeding: Secondary | ICD-10-CM | POA: Diagnosis not present

## 2013-09-02 LAB — BASIC METABOLIC PANEL
Anion gap: 9 (ref 5–15)
BUN: 10 mg/dL (ref 6–23)
CO2: 26 mEq/L (ref 19–32)
Calcium: 7.9 mg/dL — ABNORMAL LOW (ref 8.4–10.5)
Chloride: 104 mEq/L (ref 96–112)
Creatinine, Ser: 0.83 mg/dL (ref 0.50–1.10)
GFR calc Af Amer: >90 mL/min (ref >90)
GFR calc non Af Amer: 85 mL/min — ABNORMAL LOW (ref >90)
Glucose, Bld: 137 mg/dL — ABNORMAL HIGH (ref 70–99)
Potassium: 4.6 mEq/L (ref 3.7–5.3)
Sodium: 139 mEq/L (ref 137–147)

## 2013-09-02 LAB — CBC
HEMATOCRIT: 38 % (ref 36.0–46.0)
HEMOGLOBIN: 12.2 g/dL (ref 12.0–15.0)
MCH: 31.6 pg (ref 26.0–34.0)
MCHC: 32.1 g/dL (ref 30.0–36.0)
MCV: 98.4 fL (ref 78.0–100.0)
Platelets: 316 10*3/uL (ref 150–400)
RBC: 3.86 MIL/uL — ABNORMAL LOW (ref 3.87–5.11)
RDW: 12.5 % (ref 11.5–15.5)
WBC: 12.6 10*3/uL — AB (ref 4.0–10.5)

## 2013-09-02 MED ORDER — OXYCODONE-ACETAMINOPHEN 5-325 MG PO TABS: 1.0000 | ORAL_TABLET | Freq: Four times a day (QID) | ORAL | Status: DC | PRN

## 2013-09-02 MED ORDER — VITAMINS A & D EX OINT
TOPICAL_OINTMENT | CUTANEOUS | Status: DC | PRN
  Filled 2013-09-02: qty 5

## 2013-09-02 MED ORDER — NAPROXEN SODIUM ER 500 MG PO TB24: 500.0000 mg | ORAL_TABLET | Freq: Two times a day (BID) | ORAL | Status: DC | PRN

## 2013-09-02 NOTE — Op Note (Signed)
NAMEKEANA, Rita Riley              ACCOUNT NO.:  [id number]  MEDICAL RECORD NO.:  [id number]  LOCATION:  9470                          FACILITY:  [place]  PHYSICIAN:  Thornell Sartorius, MD        DATE OF BIRTH:  1970/02/09  DATE OF PROCEDURE:  09/01/2013 DATE OF DISCHARGE:                              OPERATIVE REPORT   PREOPERATIVE DIAGNOSIS:  Dysfunctional uterine bleed.  POSTOPERATIVE DIAGNOSIS:  Dysfunctional uterine bleed.  PROCEDURE:  Laparoscopic-assisted vaginal hysterectomy, bilateral salpingectomy.  SURGEON:  Thornell Sartorius, MD.  ASSISTANT:  Clarene Duke, M.D.  ANESTHESIA:  Local and general.  EBL:  75 mL.  URINE OUTPUT:  125 mL of clear urine.  IV FLUIDS:  2000 mL.  COMPLICATIONS:  None.  PATHOLOGY:  Uterus, cervix, and bilateral fallopian tubes.  PROCEDURE:  After informed consent was reviewed with the patient including risks, benefits, and alternatives of the surgical procedure, she was transported to the OR, placed on table in supine position. General anesthesia was induced and found to be adequate.  She was then placed in the [place].  Prepped and draped in the normal sterile fashion.  After Foley catheter had been sterilely placed.  Using an open-sided speculum, a Hulka manipulator was placed in her cervix. Gloves were changed.  Attention was turned to the abdominal portion of the case and approximately 1 cm infraumbilical incision was made. With some difficulty, pneumoperitoneum was obtained and trocar was placed under direct visualization. Pneumoperitoneum was insuffalated after passing hangin drop test.  Brief pelvic survey revealed small uterus, bilateral normal tubes and ovaries.  She was placed in Trendelenburg. Accessory ports were placed under direct visualization after carefully checking the placement of the epigastric vessels.  Using the Harmonic scalpel, her tubes were skimmed from the mesosalpinx, the round ligament and cardinal ligaments  were incised with a Harmonic Scalpel to the level of the uterine artery.  Bladder flap was created with the Harmonic Scalpel.  Same procedure was done on both sides.  Instruments were removed from her abdomen and attention was turned to vaginal portion of the case.  Using a heavy weighted speculum and Deaver retractor, her cervix was easily visualized and it was circumcised with Bovie cautery. First, the anterior cul-de-sac was entered, then the posterior cul-de- sac was entered.  The cardinal ligaments were ligated to the level of the uterine artery.  The uterine artery was ligated.  The uterus was delivered and several areas of bleeding were controlled with Bovie cautery.  The cuff was closed with 2-0 Vicryl in a running, locked fashion after the uterosacral ligaments had been plicated.  Gloves and gown were changed.  Attention was returned to the abdominal portion of the case.  Pelvis was inspected and found to be hemostatic with no obvious bleeding.  The trocars were removed under direct visualization.  The trocar sites were closed with 4-0 Vicryl and Dermabond.  Sponge, lap, and needle counts were correct x2.  The patient was awakened in stable condition.     Thornell Sartorius, MD     JB/MEDQ  D:  09/01/2013  T:  09/01/2013  Job:  [id number]

## 2013-09-02 NOTE — Anesthesia Postprocedure Evaluation (Signed)
  Anesthesia Post-op Note  Patient: Rita Riley  Procedure(s) Performed: Procedure(s): LAPAROSCOPIC ASSISTED VAGINAL HYSTERECTOMY, Bilateral Salpingectomy (Bilateral)  Patient Location: Women's Unit  Anesthesia Type:General  Level of Consciousness: awake, alert  and oriented  Airway and Oxygen Therapy: Patient Spontanous Breathing and Patient connected to nasal cannula oxygen  Post-op Pain: none  Post-op Assessment: Post-op Vital signs reviewed and Patient's Cardiovascular Status Stable  Post-op Vital Signs: Reviewed and stable  Last Vitals:  Filed Vitals:   09/02/13 0555  BP: 94/54  Pulse: 66  Temp: 37.1 C  Resp:     Complications: No apparent anesthesia complications

## 2013-09-02 NOTE — Progress Notes (Signed)
1 Day Post-Op Procedure(s) (LRB): LAPAROSCOPIC ASSISTED VAGINAL HYSTERECTOMY, Bilateral Salpingectomy (Bilateral)  Subjective: Patient reports incisional pain and tolerating PO.  Nl lochia  Objective: I have reviewed patient's vital signs, intake and output and labs.  General: alert and no distress Resp: clear to auscultation bilaterally Cardio: regular rate and rhythm GI: soft, non-tender; bowel sounds normal; no masses,  no organomegaly and incision: clean and dry, some bruising Extremities: extremities normal, atraumatic, no cyanosis or edema Vaginal Bleeding: minimal  Assessment: s/p Procedure(s): LAPAROSCOPIC ASSISTED VAGINAL HYSTERECTOMY, Bilateral Salpingectomy (Bilateral): stable and progressing well  Plan: Advance diet Encourage ambulation Advance to PO medication Discontinue IV fluids Discharge home when meeting criteria Desires d/c to home ASAP  LOS: 1 day    Riley, Rita Lupton 09/02/2013, 6:40 AM

## 2013-09-02 NOTE — Discharge Summary (Signed)
Physician Discharge Summary  Patient ID: Rita Riley MRN: [id number] DOB/AGE: 1970-03-14 43 y.o.  Admit date: 09/01/2013 Discharge date: 09/02/2013  Admission Diagnoses: AUB, irr bleeding  Discharge Diagnoses:  Principal Problem:   Abnormal uterine bleeding (AUB) Active Problems:   Fibroids   DUB (dysfunctional uterine bleeding)   S/P laparoscopic assisted vaginal hysterectomy (LAVH)   Discharged Condition: stable  Hospital Course: Pt admitted for LAVH, B salpingectomy; underwent surgery w/o complication.  Post operative course without complication.  Postoperative course uncomplicated able to void, ambulate, tolerate po, pain controlled.  Hgb, Cr stable.  Consults: None  Significant Diagnostic Studies: labs: CBC, CMP Treatments: IV hydration, analgesia: Dilaudid and surgery: LAVH, B salpingectomy Discharge Exam: Blood pressure 94/54, pulse 66, temperature 98.7 F (37.1 C), temperature source Oral, resp. rate 16, height 5' 4.5" (1.638 m), weight 118.842 kg (262 lb), SpO2 96.00%. General appearance: alert and no distress Resp: clear to auscultation bilaterally Cardio: regular rate and rhythm GI: soft, non-tender; bowel sounds normal; no masses,  no organomegaly Extremities: extremities normal, atraumatic, no cyanosis or edema Incision/Wound:C/D/I  Disposition:   Discharge Instructions   Call MD for:  persistant nausea and vomiting    Complete by:  As directed      Call MD for:  redness, tenderness, or signs of infection (pain, swelling, redness, odor or green/yellow discharge around incision site)    Complete by:  As directed      Call MD for:  severe uncontrolled pain    Complete by:  As directed      Diet - low sodium heart healthy    Complete by:  As directed      Discharge instructions    Complete by:  As directed   YSAY [phone] WITH QUESTIONS OR PROBLEMS     Driving Restrictions    Complete by:  As directed   While taking strong pain medicine     Increase  activity slowly    Complete by:  As directed      Lifting restrictions    Complete by:  As directed   No greater than 10-15 lbs     May shower / Bathe    Complete by:  As directed      May walk up steps    Complete by:  As directed      Sexual Activity Restrictions    Complete by:  As directed   Pelvic rest - no douching, tampons or sex for 6 weeks            Medication List    STOP taking these medications       ibuprofen 200 MG tablet  Commonly known as:  ADVIL,MOTRIN     LOESTRIN 24 FE 1-20 MG-MCG(24) tablet  Generic drug:  Norethindrone Acetate-Ethinyl Estrad-FE      TAKE these medications       aspirin-acetaminophen-caffeine 932-355-73 MG per tablet  Commonly known as:  EXCEDRIN MIGRAINE  Take 2 tablets by mouth 2 (two) times daily as needed for migraine.     BIOTIN PO  Take 2 each by mouth daily.     FIBER PO  Take 4 mg by mouth daily.     hydrocortisone cream 1 %  Apply 1 application topically 2 (two) times daily as needed for itching (For rash.).     levothyroxine 175 MCG tablet  Commonly known as:  SYNTHROID, LEVOTHROID  Take 175-262.5 mcg by mouth daily before breakfast. Patient takes 1 tablet once daily, except for Sundays  when she takes 1.5 tablets.     multivitamin with minerals Tabs tablet  Take 1 tablet by mouth daily.     naproxen 500 MG 24 hr tablet  Commonly known as:  NAPRELAN  Take 1 tablet (500 mg total) by mouth 2 (two) times daily as needed.     NEXIUM PO  Take 1 capsule by mouth daily as needed (For heartburn.).     oxyCODONE-acetaminophen 5-325 MG per tablet  Commonly known as:  PERCOCET/ROXICET  Take 1-2 tablets by mouth every 6 (six) hours as needed for severe pain (moderate to severe pain (when tolerating fluids)).     ranitidine 150 MG tablet  Commonly known as:  ZANTAC  Take 150 mg by mouth daily.     Verapamil HCl CR 300 MG Cp24  Take 1 capsule by mouth daily.     Vitamin D 2000 UNITS tablet  Take 2,000 Units by mouth  daily.           Follow-up Information   Schedule an appointment as soon as possible for a visit with Bovard-Stuckert, Delquan Poucher, MD. (for incision check, 6 wks for full postop check)    Specialty:  Obstetrics and Gynecology   Contact information:   [address] [place] [address] [phone]       Signed: Janyth Contes 09/02/2013, 9:16 AM

## 2013-09-02 NOTE — Progress Notes (Signed)
D/c home   Teaching complete

## 2013-09-02 NOTE — Addendum Note (Signed)
Addendum created 09/02/13 3112 by Jonna Munro, CRNA   Modules edited: Notes Section   Notes Section:  File: [id number]

## 2013-09-29 ENCOUNTER — Other Ambulatory Visit: Payer: Self-pay

## 2013-09-29 DIAGNOSIS — Z1231 Encounter for screening mammogram for malignant neoplasm of breast: Secondary | ICD-10-CM

## 2013-10-26 ENCOUNTER — Ambulatory Visit: Payer: Managed Care, Other (non HMO)

## 2014-05-25 ENCOUNTER — Encounter (HOSPITAL_COMMUNITY): Payer: Self-pay | Admitting: Emergency Medicine

## 2014-05-25 ENCOUNTER — Emergency Department (HOSPITAL_COMMUNITY): Payer: Managed Care, Other (non HMO)

## 2014-05-25 ENCOUNTER — Emergency Department (HOSPITAL_COMMUNITY)
Admission: EM | Admit: 2014-05-25 | Discharge: 2014-05-26 | Disposition: A | Discharge status: Home / Self Care | Payer: Managed Care, Other (non HMO) | Attending: Emergency Medicine | Admitting: Emergency Medicine

## 2014-05-25 DIAGNOSIS — R1013 Epigastric pain: Secondary | ICD-10-CM | POA: Diagnosis present

## 2014-05-25 DIAGNOSIS — K219 Gastro-esophageal reflux disease without esophagitis: Secondary | ICD-10-CM | POA: Insufficient documentation

## 2014-05-25 DIAGNOSIS — G43909 Migraine, unspecified, not intractable, without status migrainosus: Secondary | ICD-10-CM | POA: Diagnosis not present

## 2014-05-25 DIAGNOSIS — R112 Nausea with vomiting, unspecified: Secondary | ICD-10-CM | POA: Diagnosis not present

## 2014-05-25 DIAGNOSIS — Z9049 Acquired absence of other specified parts of digestive tract: Secondary | ICD-10-CM | POA: Insufficient documentation

## 2014-05-25 DIAGNOSIS — Z8742 Personal history of other diseases of the female genital tract: Secondary | ICD-10-CM | POA: Diagnosis not present

## 2014-05-25 DIAGNOSIS — Z79899 Other long term (current) drug therapy: Secondary | ICD-10-CM | POA: Diagnosis not present

## 2014-05-25 DIAGNOSIS — Z8639 Personal history of other endocrine, nutritional and metabolic disease: Secondary | ICD-10-CM | POA: Insufficient documentation

## 2014-05-25 DIAGNOSIS — Z87442 Personal history of urinary calculi: Secondary | ICD-10-CM | POA: Diagnosis not present

## 2014-05-25 DIAGNOSIS — E669 Obesity, unspecified: Secondary | ICD-10-CM | POA: Insufficient documentation

## 2014-05-25 DIAGNOSIS — R109 Unspecified abdominal pain: Secondary | ICD-10-CM | POA: Insufficient documentation

## 2014-05-25 LAB — COMPREHENSIVE METABOLIC PANEL
ALBUMIN: 3.7 g/dL (ref 3.5–5.2)
ALK PHOS: 132 U/L — AB (ref 39–117)
ALT: 33 U/L (ref 0–35)
AST: 42 U/L — ABNORMAL HIGH (ref 0–37)
Anion gap: 8 (ref 5–15)
BUN: 12 mg/dL (ref 6–23)
CO2: 22 mmol/L (ref 19–32)
Calcium: 9.2 mg/dL (ref 8.4–10.5)
Chloride: 107 mmol/L (ref 96–112)
Creatinine, Ser: 1 mg/dL (ref 0.50–1.10)
GFR calc Af Amer: 78 mL/min — ABNORMAL LOW (ref >90)
GFR calc non Af Amer: 67 mL/min — ABNORMAL LOW (ref >90)
Glucose, Bld: 138 mg/dL — ABNORMAL HIGH (ref 70–99)
POTASSIUM: 3.8 mmol/L (ref 3.5–5.1)
SODIUM: 137 mmol/L (ref 135–145)
TOTAL PROTEIN: 7 g/dL (ref 6.0–8.3)
Total Bilirubin: 0.7 mg/dL (ref 0.3–1.2)

## 2014-05-25 LAB — URINALYSIS, ROUTINE W REFLEX MICROSCOPIC
Bilirubin Urine: NEGATIVE
Glucose, UA: NEGATIVE mg/dL
HGB URINE DIPSTICK: NEGATIVE
Ketones, ur: 15 mg/dL — AB
Leukocytes, UA: NEGATIVE
Nitrite: NEGATIVE
PROTEIN: NEGATIVE mg/dL
SPECIFIC GRAVITY, URINE: 1.027 (ref 1.005–1.030)
Urobilinogen, UA: 1 mg/dL (ref 0.0–1.0)
pH: 5.5 (ref 5.0–8.0)

## 2014-05-25 LAB — CBC WITH DIFFERENTIAL/PLATELET
Basophils Absolute: 0 10*3/uL (ref 0.0–0.1)
Basophils Relative: 0 % (ref 0–1)
EOS ABS: 0.3 10*3/uL (ref 0.0–0.7)
EOS PCT: 2 % (ref 0–5)
HCT: 46 % (ref 36.0–46.0)
HEMOGLOBIN: 15.4 g/dL — AB (ref 12.0–15.0)
Lymphocytes Relative: 12 % (ref 12–46)
Lymphs Abs: 1.6 10*3/uL (ref 0.7–4.0)
MCH: 31.8 pg (ref 26.0–34.0)
MCHC: 33.5 g/dL (ref 30.0–36.0)
MCV: 94.8 fL (ref 78.0–100.0)
MONO ABS: 0.4 10*3/uL (ref 0.1–1.0)
Monocytes Relative: 3 % (ref 3–12)
NEUTROS ABS: 10.7 10*3/uL — AB (ref 1.7–7.7)
Neutrophils Relative %: 83 % — ABNORMAL HIGH (ref 43–77)
PLATELETS: 317 10*3/uL (ref 150–400)
RBC: 4.85 MIL/uL (ref 3.87–5.11)
RDW: 12.6 % (ref 11.5–15.5)
WBC: 13.1 10*3/uL — AB (ref 4.0–10.5)

## 2014-05-25 LAB — I-STAT TROPONIN, ED: TROPONIN I, POC: 0 ng/mL (ref 0.00–0.08)

## 2014-05-25 LAB — LIPASE, BLOOD: Lipase: 45 U/L (ref 11–59)

## 2014-05-25 MED ORDER — ONDANSETRON 4 MG PO TBDP
ORAL_TABLET | ORAL | Status: AC
  Filled 2014-05-25: qty 2

## 2014-05-25 MED ORDER — OXYCODONE-ACETAMINOPHEN 5-325 MG PO TABS: 1.0000 | ORAL_TABLET | Freq: Four times a day (QID) | ORAL | Status: AC | PRN

## 2014-05-25 MED ORDER — ONDANSETRON 4 MG PO TBDP
8.0000 mg | ORAL_TABLET | Freq: Once | ORAL | Status: AC
  Administered 2014-05-25: 8 mg via ORAL

## 2014-05-25 NOTE — Discharge Instructions (Signed)
You were seen today for abdominal pain. By the time you were seen, her pain had resolved. Your ultrasound shows a common bile duct which is the upper limits of normal. No obvious stone. LFTs largely reassuring. Mild elevation in your alkaline phosphatase. You need to follow-up with her GI doctor for repeat LFTs and possible MRCP.  Abdominal Pain Many things can cause abdominal pain. Usually, abdominal pain is not caused by a disease and will improve without treatment. It can often be observed and treated at home. Your health care provider will do a physical exam and possibly order blood tests and X-rays to help determine the seriousness of your pain. However, in many cases, more time must pass before a clear cause of the pain can be found. Before that point, your health care provider may not know if you need more testing or further treatment. HOME CARE INSTRUCTIONS  Monitor your abdominal pain for any changes. The following actions may help to alleviate any discomfort you are experiencing:  Only take over-the-counter or prescription medicines as directed by your health care provider.  Do not take laxatives unless directed to do so by your health care provider.  Try a clear liquid diet (broth, tea, or water) as directed by your health care provider. Slowly move to a bland diet as tolerated. SEEK MEDICAL CARE IF:  You have unexplained abdominal pain.  You have abdominal pain associated with nausea or diarrhea.  You have pain when you urinate or have a bowel movement.  You experience abdominal pain that wakes you in the night.  You have abdominal pain that is worsened or improved by eating food.  You have abdominal pain that is worsened with eating fatty foods.  You have a fever. SEEK IMMEDIATE MEDICAL CARE IF:   Your pain does not go away within 2 hours.  You keep throwing up (vomiting).  Your pain is felt only in portions of the abdomen, such as the right side or the left lower  portion of the abdomen.  You pass bloody or black tarry stools. MAKE SURE YOU:  Understand these instructions.   Will watch your condition.   Will get help right away if you are not doing well or get worse.  Document Released: 10/24/2004 Document Revised: 01/19/2013 Document Reviewed: 09/23/2012 [facility] Patient Information 2015 [place], [place]. This information is not intended to replace advice given to you by your health care provider. Make sure you discuss any questions you have with your health care provider.

## 2014-05-25 NOTE — ED Provider Notes (Signed)
CSN: [id number]     Arrival date & time 05/25/14  1622 History   First MD Initiated Contact with Patient 05/25/14 1940     No chief complaint on file.    (Consider location/radiation/quality/duration/timing/severity/associated sxs/prior Treatment) HPI  This is a 44 year old female with a history of GERD, kidney stones, IBS who presents with abdominal pain. Patient reports after eating lunch she had onset of severe epigastric abdominal pain that was nonradiating. She reports the sharp and "worse than any kidney stone ever had." She at one episode of nonbilious, nonbloody emesis, nausea, and diaphoresis. Currently patient is pain-free. She's been in the waiting room for several hours. She states that she was evaluated by Dr. Collene Mares, GI, with a colonoscopy and endoscopy approximately 10 days ago. She was found to have a hiatal hernia. She has a history of cholecystectomy. Per patient, GI doctor questioning whether the patient had retained gallstones. Patient is currently without complaint.  Past Medical History  Diagnosis Date  . Hypothyroidism   . GERD (gastroesophageal reflux disease)   . Recurrent kidney stones     several surgeries  . IBS (irritable bowel syndrome)   . Headache(784.0)     takes Verapamil ONLY for migraines  . Fibroids 08/31/2013  . Abnormal uterine bleeding (AUB) 08/31/2013  . DUB (dysfunctional uterine bleeding) 08/31/2013  . History of migraine headaches 2008   Past Surgical History  Procedure Laterality Date  . Lithotripsy    . Laparoscopic assisted vaginal hysterectomy Bilateral 09/01/2013    Procedure: LAPAROSCOPIC ASSISTED VAGINAL HYSTERECTOMY, Bilateral Salpingectomy;  Surgeon: Janyth Contes, MD;  Location: [facility];  Service: Gynecology;  Laterality: Bilateral;  . Abdominal hysterectomy    . Cholecystectomy     No family history on file. History  Substance Use Topics  . Smoking status: Never Smoker   . Smokeless tobacco: Not on file  . Alcohol Use: No    OB History    No data available     Review of Systems  Constitutional: Negative for fever.  Respiratory: Negative for chest tightness and shortness of breath.   Cardiovascular: Negative for chest pain.  Gastrointestinal: Positive for nausea, vomiting and abdominal pain.  Genitourinary: Negative for dysuria and flank pain.  Musculoskeletal: Negative for back pain.  Skin: Negative for wound.  Neurological: Negative for headaches.  Psychiatric/Behavioral: Negative for confusion.  All other systems reviewed and are negative.     Allergies  Sulfa antibiotics  Home Medications   Prior to Admission medications   Medication Sig Start Date End Date Taking? Authorizing Provider  BIOTIN PO Take 2 each by mouth daily.   Yes Historical Provider, MD  Cholecalciferol (VITAMIN D) 2000 UNITS tablet Take 2,000 Units by mouth daily.   Yes Historical Provider, MD  dexlansoprazole (DEXILANT) 60 MG capsule Take 60 mg by mouth daily.   Yes Historical Provider, MD  Porum 2 each by mouth daily.   Yes Historical Provider, MD  Multiple Vitamin (MULTIVITAMIN WITH MINERALS) TABS tablet Take 1 tablet by mouth daily.   Yes Historical Provider, MD  ranitidine (ZANTAC) 150 MG tablet Take 150 mg by mouth daily.   Yes Historical Provider, MD  SUMAtriptan (IMITREX) 100 MG tablet Take 100 mg by mouth as needed. For migraines   Yes Historical Provider, MD  verapamil (VERELAN PM) 360 MG 24 hr capsule Take 360 mg by mouth daily. 04/02/14  Yes Historical Provider, MD  oxyCODONE-acetaminophen (PERCOCET/ROXICET) 5-325 MG per tablet Take 1-2 tablets by mouth every 6 (  six) hours as needed for severe pain (moderate to severe pain (when tolerating fluids)). 05/25/14   Merryl Hacker, MD   BP 103/58 mmHg  Pulse 75  Temp(Src) 98.3 F (36.8 C)  Resp 16  Wt 272 lb (123.378 kg)  SpO2 100%  LMP 02/24/2011 Physical Exam  Constitutional: She is oriented to person, place, and time. She appears  well-developed and well-nourished.  Obese, no acute distress  HENT:  Head: Normocephalic and atraumatic.  Eyes: Pupils are equal, round, and reactive to light.  Cardiovascular: Normal rate, regular rhythm and normal heart sounds.   No murmur heard. Pulmonary/Chest: Effort normal and breath sounds normal. No respiratory distress. She has no wheezes.  Abdominal: Soft. Bowel sounds are normal. There is no tenderness. There is no rebound and no guarding.  Neurological: She is alert and oriented to person, place, and time.  Skin: Skin is warm and dry.  Psychiatric: She has a normal mood and affect.  Nursing note and vitals reviewed.   ED Course  Procedures (including critical care time) Labs Review Labs Reviewed  CBC WITH DIFFERENTIAL/PLATELET - Abnormal; Notable for the following:    WBC 13.1 (*)    Hemoglobin 15.4 (*)    Neutrophils Relative % 83 (*)    Neutro Abs 10.7 (*)    All other components within normal limits  COMPREHENSIVE METABOLIC PANEL - Abnormal; Notable for the following:    Glucose, Bld 138 (*)    AST 42 (*)    Alkaline Phosphatase 132 (*)    GFR calc non Af Amer 67 (*)    GFR calc Af Amer 78 (*)    All other components within normal limits  URINALYSIS, ROUTINE W REFLEX MICROSCOPIC - Abnormal; Notable for the following:    Ketones, ur 15 (*)    All other components within normal limits  LIPASE, BLOOD  I-STAT TROPOININ, ED    Imaging Review US Abdomen Limited Ruq  05/25/2014   CLINICAL DATA:  44 year old female with epigastric pain  EXAM: US ABDOMEN LIMITED - RIGHT UPPER QUADRANT  COMPARISON:  None.  FINDINGS: Gallbladder:  Surgically absent.  Common bile duct:  Diameter: Borderline dilated at 7 mm. No evidence of distal stone or obstructing mass. No intrahepatic biliary ductal dilatation.  Liver:  No focal lesion identified. Within normal limits in parenchymal echogenicity. Main portal vein is patent with normal hepatopetal flow. 1.9 cm sonographically simple cyst  noted incidentally within the right hepatic lobe.  IMPRESSION: 1. Prior cholecystectomy. 2. Borderline dilated common bile duct at 7 mm. This may be within normal limits given history of prior cholecystectomy. Recommend clinical correlation with serum LFTs to evaluate for evidence of biliary obstruction. If clinical concern persists, MRCP would be the test of choice for further evaluation. 3. Simple right hepatic cyst.   Electronically Signed   By: Jacqulynn Cadet M.D.   On: 05/25/2014 21:59     EKG Interpretation None      MDM   Final diagnoses:  Abdominal pain    Patient presents with abdominal pain. Onset after eating. Reports nausea and associated vomiting. On my evaluation, patient is largely symptom-free. Recent workup by GI including colonoscopy and endoscopy. Will obtain an ultrasound of the right upper quadrant to evaluate for retained gallstones. She is status post cholecystectomy. She's remained symptom-free while in the ER. Alkaline phosphatase is mildly elevated from baseline at 132. Otherwise workup is reassuring. Ultrasound with borderline dilated common bile duct at 7 mm. This is of unknown  significance. Given that the patient has been pain-free while in the emergency room, will have her follow-up with her GI doc for further evaluation and repeat LFTs. She may need MRCP. Patient agrees with plan.  After history, exam, and medical workup I feel the patient has been appropriately medically screened and is safe for discharge home. Pertinent diagnoses were discussed with the patient. Patient was given return precautions.     Merryl Hacker, MD 05/26/14 0000

## 2014-05-25 NOTE — ED Notes (Signed)
C/o severe 10/10 mid upper abd pain/Epigastric pain that started at 2:30pm with diaphoresis, nausea, and vomited x 1.  States since arrival pain has decreased to 7/10.  Colonoscopy and Endoscopy 1 1/2 weeks ago by Dr. Collene Mares.  Taking meds for GERD.

## 2014-06-07 ENCOUNTER — Other Ambulatory Visit: Payer: Self-pay | Admitting: Gastroenterology

## 2014-06-07 ENCOUNTER — Ambulatory Visit (HOSPITAL_COMMUNITY)
Admission: RE | Admit: 2014-06-07 | Discharge: 2014-06-07 | Disposition: A | Discharge status: Home / Self Care | Payer: Managed Care, Other (non HMO) | Source: Ambulatory Visit | Attending: Gastroenterology | Admitting: Gastroenterology

## 2014-06-07 DIAGNOSIS — Z9049 Acquired absence of other specified parts of digestive tract: Secondary | ICD-10-CM | POA: Insufficient documentation

## 2014-06-07 DIAGNOSIS — K449 Diaphragmatic hernia without obstruction or gangrene: Secondary | ICD-10-CM | POA: Insufficient documentation

## 2014-06-07 DIAGNOSIS — K589 Irritable bowel syndrome without diarrhea: Secondary | ICD-10-CM | POA: Insufficient documentation

## 2014-06-07 DIAGNOSIS — R1013 Epigastric pain: Secondary | ICD-10-CM | POA: Insufficient documentation

## 2014-06-07 DIAGNOSIS — R112 Nausea with vomiting, unspecified: Secondary | ICD-10-CM | POA: Insufficient documentation

## 2014-06-07 DIAGNOSIS — N281 Cyst of kidney, acquired: Secondary | ICD-10-CM | POA: Diagnosis not present

## 2014-06-07 DIAGNOSIS — R197 Diarrhea, unspecified: Secondary | ICD-10-CM | POA: Insufficient documentation

## 2014-06-07 DIAGNOSIS — K7689 Other specified diseases of liver: Secondary | ICD-10-CM | POA: Insufficient documentation

## 2014-06-07 MED ORDER — IOHEXOL 300 MG/ML  SOLN
100.0000 mL | Freq: Once | INTRAMUSCULAR | Status: AC | PRN
  Administered 2014-06-07: 100 mL via INTRAVENOUS

## 2015-10-09 ENCOUNTER — Encounter: Payer: Self-pay | Admitting: Pediatrics

## 2015-10-09 ENCOUNTER — Ambulatory Visit (INDEPENDENT_AMBULATORY_CARE_PROVIDER_SITE_OTHER): Payer: Managed Care, Other (non HMO) | Admitting: Pediatrics

## 2015-10-09 VITALS — BP 118/72 | HR 76 | Temp 98.3°F | Resp 20 | Ht 64.76 in | Wt 265.0 lb

## 2015-10-09 DIAGNOSIS — L500 Allergic urticaria: Secondary | ICD-10-CM | POA: Diagnosis not present

## 2015-10-09 DIAGNOSIS — L253 Unspecified contact dermatitis due to other chemical products: Secondary | ICD-10-CM | POA: Insufficient documentation

## 2015-10-09 NOTE — Progress Notes (Signed)
[place] [address] Dept: [phone]  New Patient Note  Patient ID: Rita Riley, female    DOB: [birth year]  Age: 45 y.o. MRN: [id number] Date of Office Visit: 10/09/2015 Referring provider: Robyne Peers, MD [id number] [place], [place] [address]    Chief Complaint: Allergic Reaction (lip balm caused extreme lip swelling and a rash around lips with water blisters and also happened after several uses of the vaseline rosy lips.); Food Intolerance (tomatoes causes pt.'s mouth to burn inside if she eats a lot of tomatoes.); and Allergic Rhinitis  (hx of immunizations short term. )  HPI Rita Riley presents for evaluation of a rash after applying emu oil chapstick  to her lips. She developed irritation, burning, itching and tingling. She improved with hydrocortisone. About a week later she tried Vaseline lip cream and developed burning of her lips and tingling. In the past,  if she eats a lot of tomatoes or  pizza she has some burning of her mouth and lips. She has a history of allergic rhinitis which improved with the use of allergy injections. She does not report any topical allergies otherwise.  Review of Systems  Constitutional: Negative.   HENT:       History of allergic rhinitis. Took allergy injections. Doing well now. Tonsillectomy.  Eyes: Negative.   Respiratory: Negative.   Cardiovascular: Negative.   Gastrointestinal:       Gastroesophageal reflux and IBS. Cholecystectomy  Genitourinary:       Hysterectomy and several kidney stones  Musculoskeletal: Negative.   Skin:       Itchy lips from lip balm. 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 History of a goiter. Hypothyroidism.  Psychiatric/Behavioral: Negative.     Outpatient Encounter Prescriptions as of 10/09/2015  Medication Sig  . ALPRAZolam (XANAX) 0.25 MG tablet Take 0.25 mg by mouth.  . Biotin (RA  BIOTIN) 2.5 MG CAPS Take by mouth.  . Cholecalciferol (VITAMIN D) 2000 UNITS tablet Take 2,000 Units by mouth daily.  . Cholestyramine POWD by Does not apply route.  . clobetasol ointment (TEMOVATE) 0.05 % Apply topically.  . dicyclomine (BENTYL) 20 MG tablet Take 20 mg by mouth.  . esomeprazole (NEXIUM) 40 MG capsule Take 40 mg by mouth.  . FIBER SELECT GUMMIES CHEW Chew 2 each by mouth daily.  Marland Kitchen gabapentin (NEURONTIN) 100 MG capsule One tablet at night time  . hydrocortisone 2.5 % ointment Apply to lips twice daily until improved  . levothyroxine (SYNTHROID, LEVOTHROID) 175 MCG tablet Take by mouth.  . Multiple Vitamin (MULTIVITAMIN WITH MINERALS) TABS tablet Take 1 tablet by mouth daily.  . ranitidine (ZANTAC) 150 MG capsule Take 150 mg by mouth.  . SUMAtriptan (IMITREX) 100 MG tablet Take 1 at onset of migraine. May repeat in 2 hours if needed. No more than 200 mg in 1 day  . verapamil (VERELAN PM) 240 MG 24 hr capsule Take 240 mg by mouth. One capsule twice daily  . dexlansoprazole (DEXILANT) 60 MG capsule Take 60 mg by mouth daily.  Marland Kitchen oxyCODONE-acetaminophen (PERCOCET/ROXICET) 5-325 MG per tablet Take 1-2 tablets by mouth every 6 (six) hours as needed for severe pain (moderate to severe pain (when tolerating fluids)). (Patient not taking: Reported on 10/09/2015)  . [DISCONTINUED] BIOTIN PO Take 2 each by mouth daily.  . [DISCONTINUED] Ergocalciferol (VITAMIN D2) 2000 units  TABS Take by mouth.  . [DISCONTINUED] Multiple Vitamin (MULTIVITAMIN) capsule Take by mouth.  . [DISCONTINUED] ranitidine (ZANTAC) 150 MG tablet Take 150 mg by mouth daily.  . [DISCONTINUED] SUMAtriptan (IMITREX) 100 MG tablet Take 100 mg by mouth as needed. For migraines  . [DISCONTINUED] verapamil (VERELAN PM) 360 MG 24 hr capsule Take 360 mg by mouth daily.   No facility-administered encounter medications on file as of 10/09/2015.      Drug Allergies:  Allergies  Allergen Reactions  . Sulfa Antibiotics Rash     Family History: Vertis's family history is not on file..Family history is negative for asthma, hayfever, sinus problems, eczema, hives, and food allergies.  Social and environmental. There is a dog in the home. She has never smoked cigarettes. She is not exposed to cigarette smoke. She works indoors. Her symptoms are not worse at work.  Physical Exam: BP 118/72 (BP Location: Left Arm, Patient Position: Sitting, Cuff Size: Large)   Pulse 76   Temp 98.3 F (36.8 C) (Oral)   Resp 20   Ht 5' 4.76" (1.645 m)   Wt 264 lb 15.9 oz (120.2 kg)   LMP 02/24/2011   BMI 44.42 kg/m    Physical Exam  Constitutional: She is oriented to person, place, and time. She appears well-developed and well-nourished.  HENT:  Eyes normal. Ears normal. Nose normal. Pharynx normal.  Neck: Neck supple. Thyromegaly present.  Cardiovascular:  S1 and S2 normal no murmurs  Pulmonary/Chest:  Clear to percussion auscultation  Abdominal: Soft. There is no tenderness (no hepatosplenomegaly).  Lymphadenopathy:    She has no cervical adenopathy.  Neurological: She is alert and oriented to person, place, and time.  Psychiatric: She has a normal mood and affect. Her behavior is normal. Judgment and thought content normal.  Vitals reviewed.   Diagnostics: Allergy skin tests showed mild reactivity to tomato, lobster and sesame. Her other skin testing to foods was negative. Skin testing to dog was negative   Assessment  Assessment and Plan: 1. Allergic urticaria   2. Contact dermatitis due to chemicals        Patient Instructions  Avoid large amounts of tomato. Do not let lobster or sesame touch your  lips If you are going to have have very acidic or spicy foods, try to avoid them touching your  lips Claritin 10 mg once a day if needed for runny nose or itching Try Eucerin cream on your lips if they're very dry. You may try a small dose of Eucerin in one arm and make sure that it does not make you break  out Call me if you're not doing better on this treatment plan If you  do not not improve on this treatment plan, I suggest seeing your dermatologist for patch testing   Return if symptoms worsen or fail to improve.   Thank you for the opportunity to care for this patient.  Please do not hesitate to contact me with questions.  Penne Lash, M.D.  [facility] [address] [place], [place] [address] [phone]

## 2015-10-09 NOTE — Patient Instructions (Addendum)
Avoid large amounts of tomato. Do not let lobster or sesame touch your  lips If you are going to have have very acidic or spicy foods, try to avoid them touching your  lips Claritin 10 mg once a day if needed for runny nose or itching Try Eucerin cream on your lips if they're very dry. You may try a small dose of Eucerin in one arm and make sure that it does not make you break out Call me if you're not doing better on this treatment plan If you  do not not improve on this treatment plan, I suggest seeing your dermatologist for patch testing

## 2016-08-29 IMAGING — US US ABDOMEN LIMITED
1 series · 14 of 22 positions shown · non-contrast
Comparison: None.

CLINICAL DATA: 43-year-old female with epigastric pain

EXAM:
US ABDOMEN LIMITED - RIGHT UPPER QUADRANT

[Series 1: us abdomen limited · 0.26mm/px · 14 of 22 slices shown]
[im 1/22]
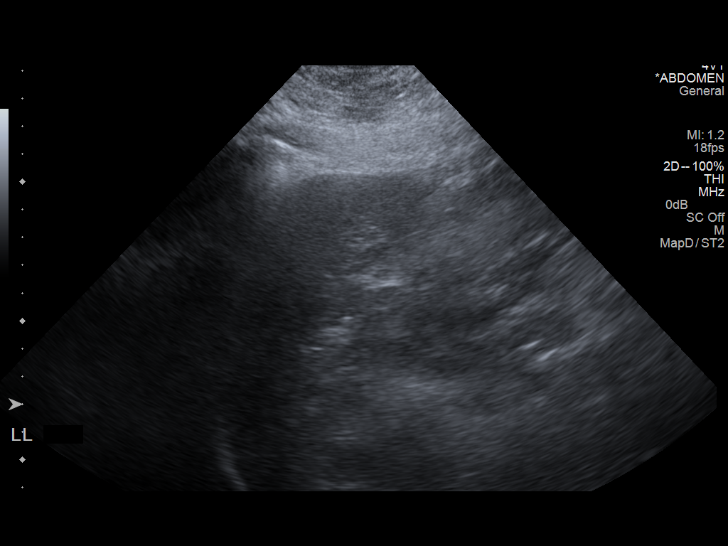
[im 3/22]
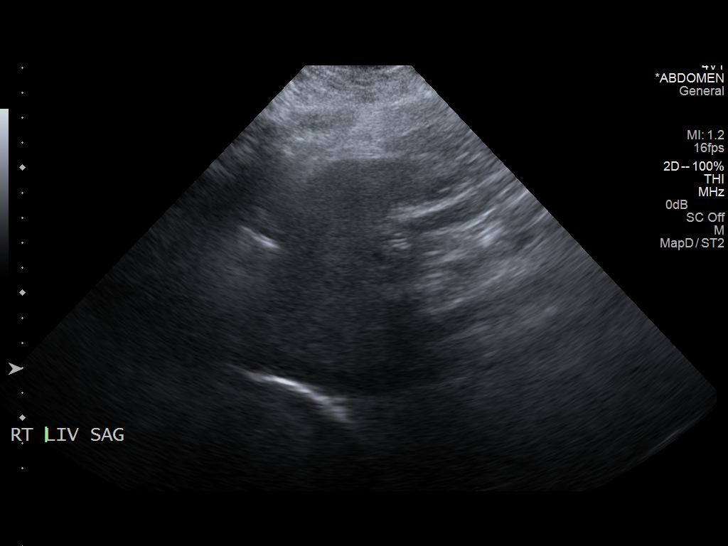
[im 4/22]
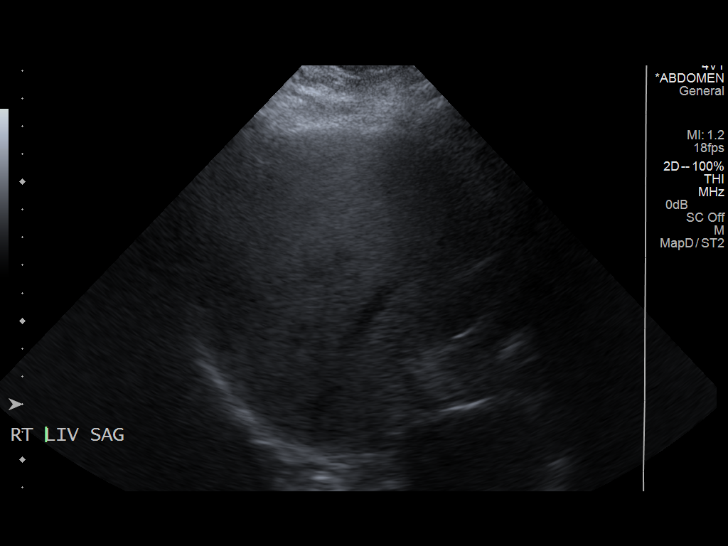
[im 6/22]
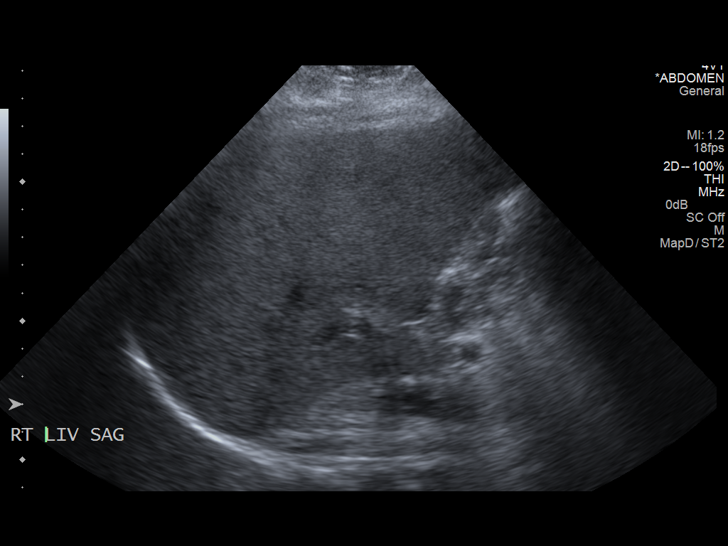
[im 8/22]
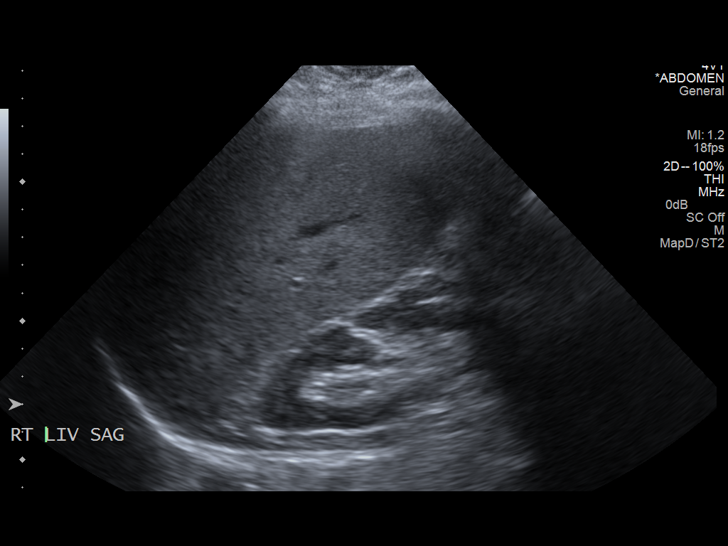
[im 9/22]
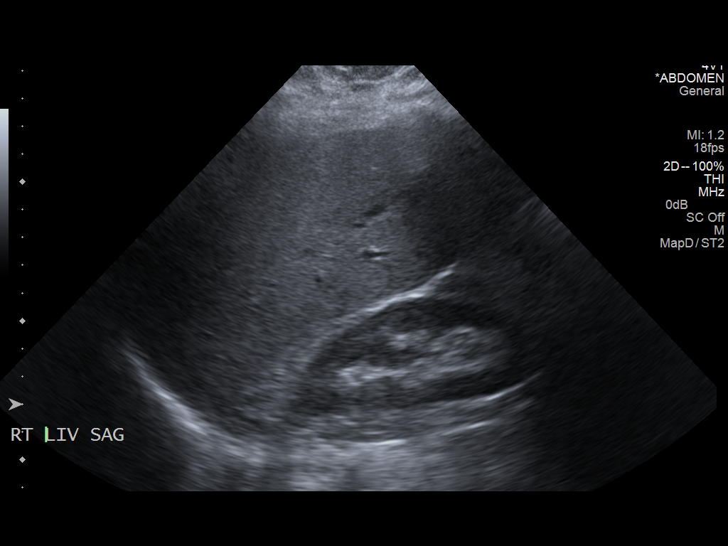
[im 11/22]
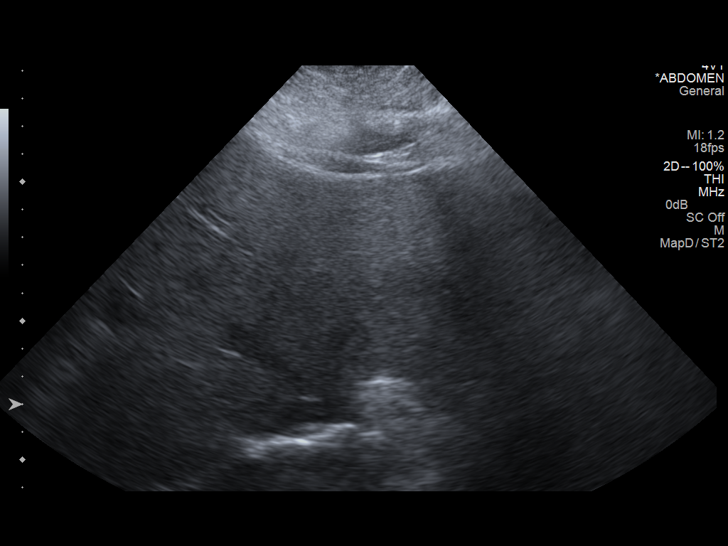
[im 12/22]
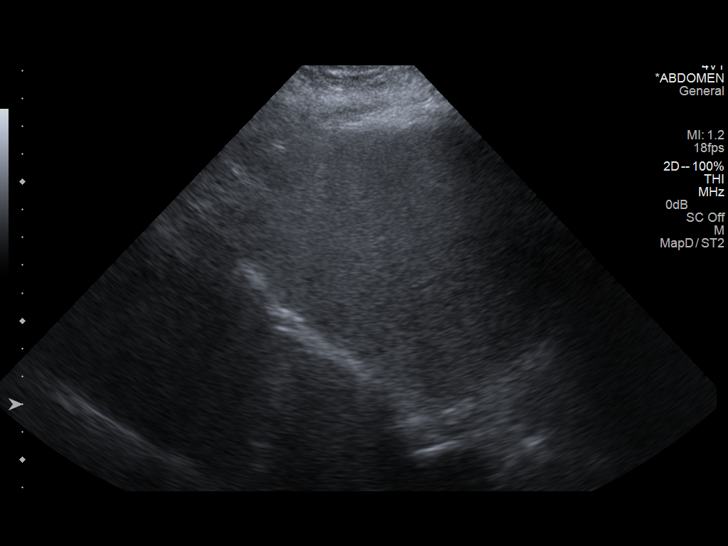
[im 14/22]
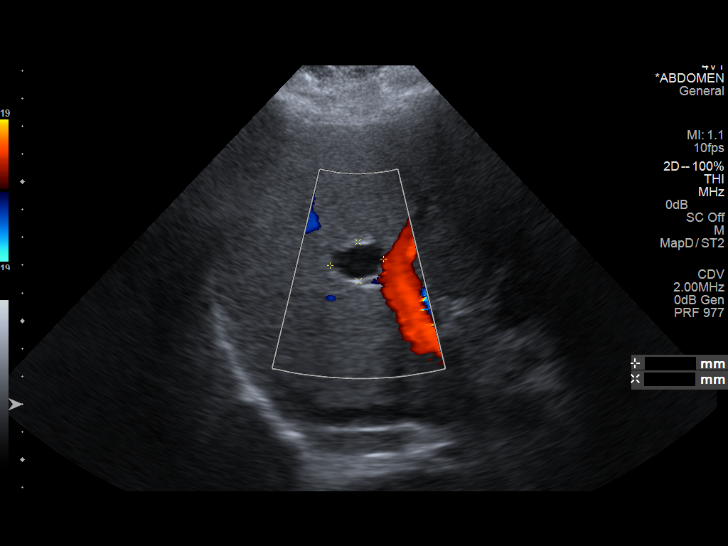
[im 15/22]
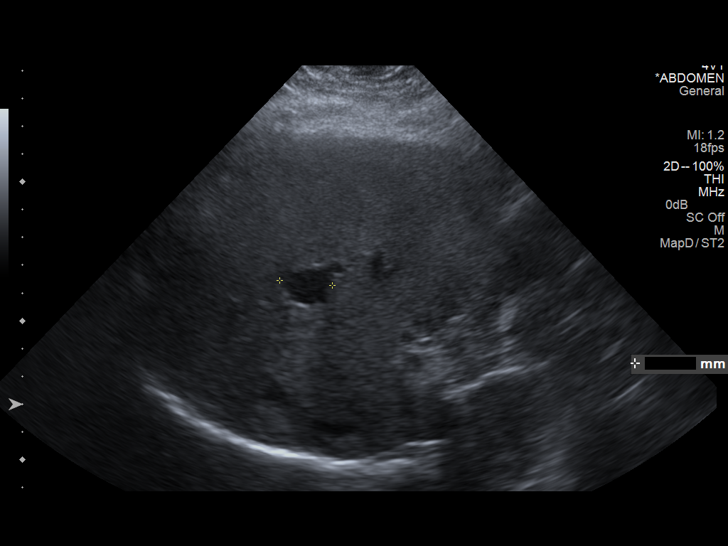
[im 17/22]
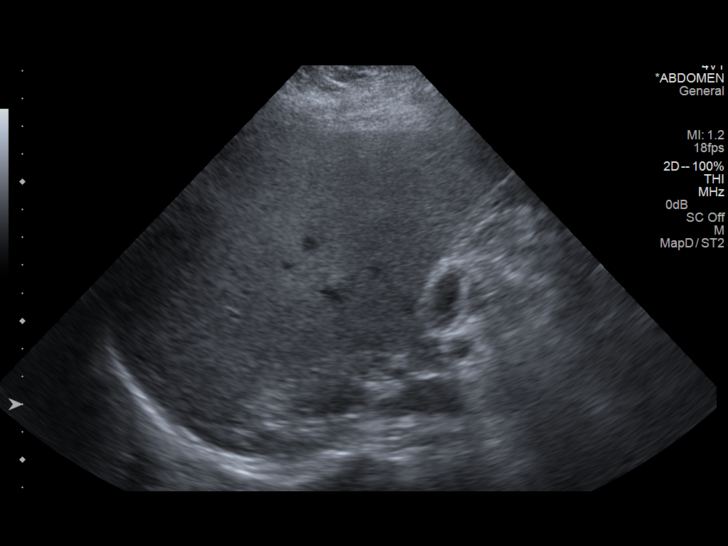
[im 19/22]
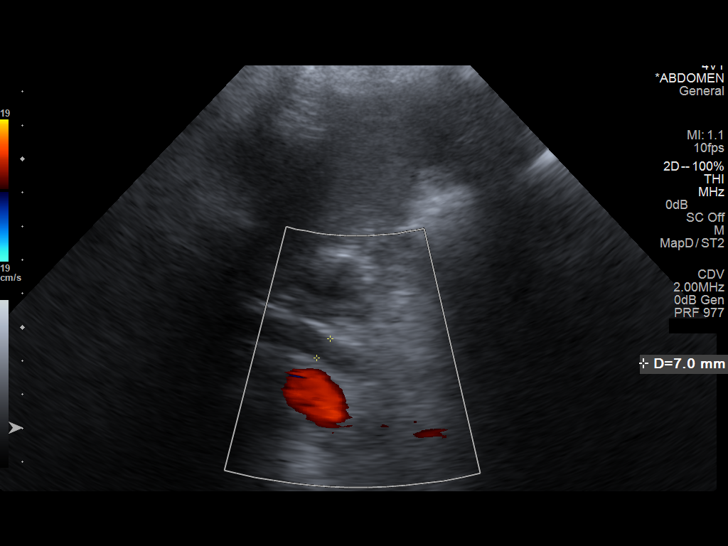
[im 20/22]
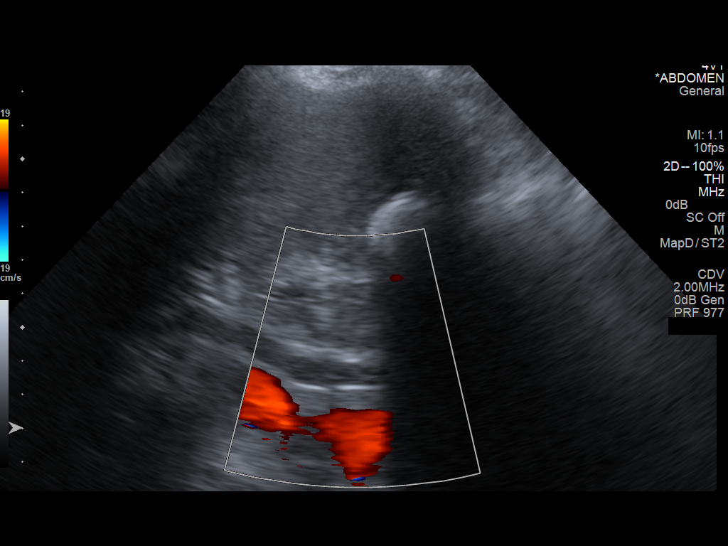
[im 22/22]
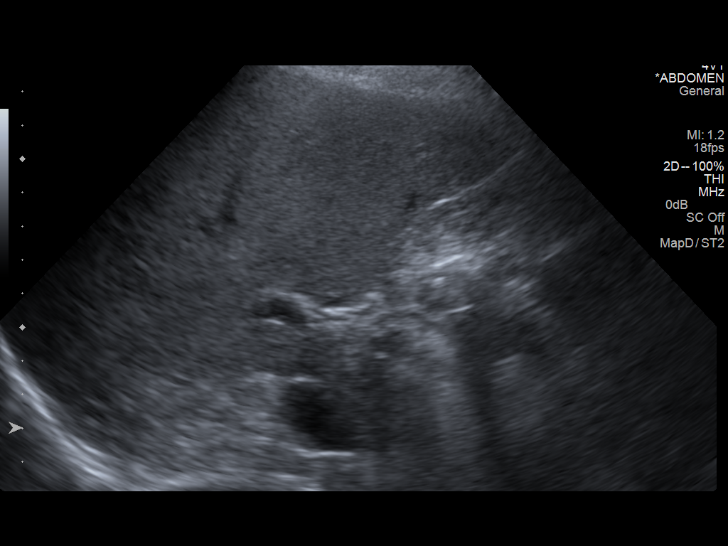

[14 of 22 positions shown; findings below may reference images not displayed]

FINDINGS: Gallbladder:

Surgically absent.

Common bile duct:

Diameter: Borderline dilated at 7 mm. No evidence of distal stone or
obstructing mass. No intrahepatic biliary ductal dilatation.

Liver:

No focal lesion identified. Within normal limits in parenchymal
echogenicity. Main portal vein is patent with normal hepatopetal
flow. 1.9 cm sonographically simple cyst noted incidentally within
the right hepatic lobe.
IMPRESSION: 1. Prior cholecystectomy.
2. Borderline dilated common bile duct at 7 mm. This may be within
normal limits given history of prior cholecystectomy. Recommend
clinical correlation with serum LFTs to evaluate for evidence of
biliary obstruction. If clinical concern persists, MRCP would be the
test of choice for further evaluation.
3. Simple right hepatic cyst.

## 2018-04-06 DIAGNOSIS — E871 Hypo-osmolality and hyponatremia: Secondary | ICD-10-CM

## 2018-04-06 DIAGNOSIS — E041 Nontoxic single thyroid nodule: Secondary | ICD-10-CM

## 2018-04-06 DIAGNOSIS — R0902 Hypoxemia: Secondary | ICD-10-CM

## 2018-04-06 DIAGNOSIS — J189 Pneumonia, unspecified organism: Secondary | ICD-10-CM | POA: Diagnosis not present

## 2018-04-06 DIAGNOSIS — R0600 Dyspnea, unspecified: Secondary | ICD-10-CM

## 2018-04-06 DIAGNOSIS — E876 Hypokalemia: Secondary | ICD-10-CM | POA: Diagnosis not present

## 2018-04-07 DIAGNOSIS — R0902 Hypoxemia: Secondary | ICD-10-CM | POA: Diagnosis not present

## 2018-04-07 DIAGNOSIS — E876 Hypokalemia: Secondary | ICD-10-CM | POA: Diagnosis not present

## 2018-04-07 DIAGNOSIS — J189 Pneumonia, unspecified organism: Secondary | ICD-10-CM | POA: Diagnosis not present

## 2018-04-07 DIAGNOSIS — R0600 Dyspnea, unspecified: Secondary | ICD-10-CM | POA: Diagnosis not present

## 2018-04-08 DIAGNOSIS — R0902 Hypoxemia: Secondary | ICD-10-CM | POA: Diagnosis not present

## 2018-04-08 DIAGNOSIS — J189 Pneumonia, unspecified organism: Secondary | ICD-10-CM | POA: Diagnosis not present

## 2018-04-08 DIAGNOSIS — E876 Hypokalemia: Secondary | ICD-10-CM | POA: Diagnosis not present

## 2018-04-08 DIAGNOSIS — R0600 Dyspnea, unspecified: Secondary | ICD-10-CM | POA: Diagnosis not present

## 2018-04-10 ENCOUNTER — Other Ambulatory Visit: Payer: Self-pay

## 2018-04-10 ENCOUNTER — Inpatient Hospital Stay (HOSPITAL_COMMUNITY): Payer: 59

## 2018-04-10 ENCOUNTER — Encounter (HOSPITAL_COMMUNITY): Payer: Self-pay

## 2018-04-10 ENCOUNTER — Inpatient Hospital Stay (HOSPITAL_COMMUNITY)
Admission: AD | Admit: 2018-04-10 | Discharge: 2018-04-13 | DRG: 193 | Disposition: A | Discharge status: Home / Self Care | Payer: 59 | Source: Ambulatory Visit | Attending: Internal Medicine | Admitting: Internal Medicine

## 2018-04-10 DIAGNOSIS — E041 Nontoxic single thyroid nodule: Secondary | ICD-10-CM | POA: Diagnosis present

## 2018-04-10 DIAGNOSIS — G43909 Migraine, unspecified, not intractable, without status migrainosus: Secondary | ICD-10-CM | POA: Diagnosis present

## 2018-04-10 DIAGNOSIS — Z881 Allergy status to other antibiotic agents status: Secondary | ICD-10-CM | POA: Diagnosis not present

## 2018-04-10 DIAGNOSIS — J181 Lobar pneumonia, unspecified organism: Secondary | ICD-10-CM | POA: Diagnosis not present

## 2018-04-10 DIAGNOSIS — J189 Pneumonia, unspecified organism: Secondary | ICD-10-CM | POA: Diagnosis present

## 2018-04-10 DIAGNOSIS — E876 Hypokalemia: Secondary | ICD-10-CM | POA: Diagnosis present

## 2018-04-10 DIAGNOSIS — R0602 Shortness of breath: Secondary | ICD-10-CM | POA: Diagnosis not present

## 2018-04-10 DIAGNOSIS — E039 Hypothyroidism, unspecified: Secondary | ICD-10-CM | POA: Diagnosis present

## 2018-04-10 DIAGNOSIS — Z20828 Contact with and (suspected) exposure to other viral communicable diseases: Secondary | ICD-10-CM | POA: Diagnosis present

## 2018-04-10 DIAGNOSIS — Z6841 Body Mass Index (BMI) 40.0 and over, adult: Secondary | ICD-10-CM

## 2018-04-10 DIAGNOSIS — R918 Other nonspecific abnormal finding of lung field: Secondary | ICD-10-CM | POA: Diagnosis present

## 2018-04-10 DIAGNOSIS — Z882 Allergy status to sulfonamides status: Secondary | ICD-10-CM

## 2018-04-10 DIAGNOSIS — J9601 Acute respiratory failure with hypoxia: Secondary | ICD-10-CM | POA: Diagnosis present

## 2018-04-10 DIAGNOSIS — J159 Unspecified bacterial pneumonia: Principal | ICD-10-CM | POA: Diagnosis present

## 2018-04-10 DIAGNOSIS — R197 Diarrhea, unspecified: Secondary | ICD-10-CM

## 2018-04-10 HISTORY — DX: Migraine, unspecified, not intractable, without status migrainosus: G43.909

## 2018-04-10 HISTORY — DX: Lichen sclerosus et atrophicus: L90.0

## 2018-04-10 LAB — CBC
HCT: 43.3 % (ref 36.0–46.0)
Hemoglobin: 13.5 g/dL (ref 12.0–15.0)
MCH: 30.9 pg (ref 26.0–34.0)
MCHC: 31.2 g/dL (ref 30.0–36.0)
MCV: 99.1 fL (ref 80.0–100.0)
Platelets: 328 10*3/uL (ref 150–400)
RBC: 4.37 MIL/uL (ref 3.87–5.11)
RDW: 12.1 % (ref 11.5–15.5)
WBC: 6.4 10*3/uL (ref 4.0–10.5)
nRBC: 0 % (ref 0.0–0.2)

## 2018-04-10 LAB — COMPREHENSIVE METABOLIC PANEL
ALT: 56 U/L — ABNORMAL HIGH (ref 0–44)
AST: 52 U/L — ABNORMAL HIGH (ref 15–41)
Albumin: 3.1 g/dL — ABNORMAL LOW (ref 3.5–5.0)
Alkaline Phosphatase: 98 U/L (ref 38–126)
Anion gap: 9 (ref 5–15)
BUN: <5 mg/dL — ABNORMAL LOW (ref 6–20)
CO2: 25 mmol/L (ref 22–32)
Calcium: 8.2 mg/dL — ABNORMAL LOW (ref 8.9–10.3)
Chloride: 106 mmol/L (ref 98–111)
Creatinine, Ser: 0.74 mg/dL (ref 0.44–1.00)
GFR calc Af Amer: >60 mL/min (ref >60)
GFR calc non Af Amer: >60 mL/min (ref >60)
Glucose, Bld: 99 mg/dL (ref 70–99)
POTASSIUM: 3.6 mmol/L (ref 3.5–5.1)
SODIUM: 140 mmol/L (ref 135–145)
Total Bilirubin: 0.9 mg/dL (ref 0.3–1.2)
Total Protein: 6.5 g/dL (ref 6.5–8.1)

## 2018-04-10 LAB — RESPIRATORY PANEL BY PCR
Adenovirus: NOT DETECTED
Bordetella pertussis: NOT DETECTED
Chlamydophila pneumoniae: NOT DETECTED
Coronavirus 229E: NOT DETECTED
Coronavirus HKU1: NOT DETECTED
Coronavirus NL63: NOT DETECTED
Coronavirus OC43: NOT DETECTED
Influenza A: NOT DETECTED
Influenza B: NOT DETECTED
Metapneumovirus: NOT DETECTED
Mycoplasma pneumoniae: NOT DETECTED
PARAINFLUENZA VIRUS 1-RVPPCR: NOT DETECTED
Parainfluenza Virus 2: NOT DETECTED
Parainfluenza Virus 3: NOT DETECTED
Parainfluenza Virus 4: NOT DETECTED
RESPIRATORY SYNCYTIAL VIRUS-RVPPCR: NOT DETECTED
Rhinovirus / Enterovirus: NOT DETECTED

## 2018-04-10 LAB — PROTIME-INR
INR: 0.9 (ref 0.8–1.2)
Prothrombin Time: 12.4 seconds (ref 11.4–15.2)

## 2018-04-10 LAB — MRSA PCR SCREENING: MRSA by PCR: NEGATIVE

## 2018-04-10 LAB — LACTIC ACID, PLASMA
Lactic Acid, Venous: 1.4 mmol/L (ref 0.5–1.9)
Lactic Acid, Venous: 1.3 mmol/L (ref 0.5–1.9)

## 2018-04-10 LAB — LIPASE, BLOOD: Lipase: 44 U/L (ref 11–51)

## 2018-04-10 LAB — AMYLASE: Amylase: 53 U/L (ref 28–100)

## 2018-04-10 LAB — STREP PNEUMONIAE URINARY ANTIGEN: Strep Pneumo Urinary Antigen: NEGATIVE

## 2018-04-10 LAB — APTT: aPTT: 30 seconds (ref 24–36)

## 2018-04-10 LAB — PHOSPHORUS: Phosphorus: 2.3 mg/dL — ABNORMAL LOW (ref 2.5–4.6)

## 2018-04-10 LAB — PROCALCITONIN: Procalcitonin: <0.1 ng/mL

## 2018-04-10 LAB — CORTISOL: CORTISOL PLASMA: 7.1 ug/dL

## 2018-04-10 LAB — MAGNESIUM: Magnesium: 2 mg/dL (ref 1.7–2.4)

## 2018-04-10 MED ORDER — VANCOMYCIN HCL 10 G IV SOLR
2500.0000 mg | Freq: Once | INTRAVENOUS | Status: AC
  Administered 2018-04-10: 2500 mg via INTRAVENOUS
  Filled 2018-04-10: qty 2000

## 2018-04-10 MED ORDER — SODIUM CHLORIDE 0.9 % IV SOLN
2.0000 g | Freq: Three times a day (TID) | INTRAVENOUS | Status: DC
  Administered 2018-04-10 – 2018-04-13 (×8): 2 g via INTRAVENOUS
  Filled 2018-04-10 (×9): qty 2

## 2018-04-10 MED ORDER — PANTOPRAZOLE SODIUM 40 MG IV SOLR
40.0000 mg | Freq: Every day | INTRAVENOUS | Status: DC
  Administered 2018-04-10 – 2018-04-12 (×3): 40 mg via INTRAVENOUS
  Filled 2018-04-10 (×3): qty 40

## 2018-04-10 MED ORDER — ORAL CARE MOUTH RINSE
15.0000 mL | Freq: Two times a day (BID) | OROMUCOSAL | Status: DC
  Administered 2018-04-10 – 2018-04-12 (×5): 15 mL via OROMUCOSAL

## 2018-04-10 MED ORDER — ALBUTEROL SULFATE (2.5 MG/3ML) 0.083% IN NEBU
2.5000 mg | INHALATION_SOLUTION | RESPIRATORY_TRACT | Status: DC | PRN
  Administered 2018-04-11: 2.5 mg via RESPIRATORY_TRACT
  Filled 2018-04-10: qty 3

## 2018-04-10 MED ORDER — SODIUM CHLORIDE 0.9 % IV SOLN
2.0000 g | Freq: Once | INTRAVENOUS | Status: AC
  Administered 2018-04-10: 2 g via INTRAVENOUS
  Filled 2018-04-10: qty 2

## 2018-04-10 MED ORDER — ACETAMINOPHEN 325 MG PO TABS: 650.0000 mg | ORAL_TABLET | ORAL | Status: DC | PRN

## 2018-04-10 MED ORDER — VANCOMYCIN HCL IN DEXTROSE 750-5 MG/150ML-% IV SOLN
750.0000 mg | Freq: Three times a day (TID) | INTRAVENOUS | Status: DC
  Administered 2018-04-10 – 2018-04-13 (×7): 750 mg via INTRAVENOUS
  Filled 2018-04-10 (×9): qty 150

## 2018-04-10 MED ORDER — ONDANSETRON HCL 4 MG/2ML IJ SOLN
4.0000 mg | Freq: Four times a day (QID) | INTRAMUSCULAR | Status: DC | PRN
  Administered 2018-04-13: 4 mg via INTRAVENOUS
  Filled 2018-04-10: qty 2

## 2018-04-10 MED ORDER — SODIUM CHLORIDE 0.9 % IV SOLN
500.0000 mg | INTRAVENOUS | Status: DC
  Administered 2018-04-10 – 2018-04-12 (×3): 500 mg via INTRAVENOUS
  Filled 2018-04-10 (×4): qty 500

## 2018-04-10 MED ORDER — SODIUM CHLORIDE 0.9 % IV SOLN
INTRAVENOUS | Status: DC
  Administered 2018-04-10 – 2018-04-12 (×3): via INTRAVENOUS

## 2018-04-10 NOTE — Consult Note (Signed)
[facility] for Infectious Disease    Date of Admission:  04/10/2018   Total days of antibiotics: 0 azithro,        cefepime, vanco               Reason for Consult: Pneumonia    Referring Provider: CHAMP   Assessment: Bilateral pneumonia ? Sarcoid    Plan: 1. Would start her on broad anbx 2. Check resp panel 3. F/u on her Corona19 test sent to [facility].   4. CCM is eval,  5. Not clear is sarcoid is a real consideration if CT is a result of atrypical pna. She'll eventually need repeat CT.   Thank you so much for this interesting consult,  Active Problems:   * No active hospital problems. *     HPI: Rita Riley is a 48 y.o. female who was in meetings: (< 20 people) left [facility] Feb 25 --> [place], [place] --> [place] 26, then [place] --> [place] 28.  Her husband was also ill during this period. She was in Urgent Care (flu A/B -) then [facility] hospital 3-9 to 3-11 and received IV anbx in ED and then PO while in hospital. She was d/c home on levaquin. While there she had a CT that showed: atypical pneumonia, L thyroid nodule, no PE.  Her CT also was notable for possible sarcoid. Her resp viral panel was not done/sample error. Her D-dimer was 1137. Her ACE level was normal.  She has had worsening SOB, crackling sounds with breathing, so returned to [facility] today.    Review of Systems: Review of Systems  Constitutional: Positive for malaise/fatigue. Negative for chills and fever.  Respiratory: Positive for cough, sputum production, shortness of breath and wheezing. Negative for hemoptysis.   Gastrointestinal: Positive for diarrhea. Negative for abdominal pain.  Genitourinary: Negative for dysuria.  Please see HPI. All other systems reviewed and negative.   No past medical history on file.  Social History   Tobacco Use  . Smoking status: Not on file  Substance Use Topics  . Alcohol use: Not on file  . Drug use: Not on file    No family  history on file.   Medications: Scheduled:  Abtx:  Anti-infectives (From admission, onward)   None        OBJECTIVE: There were no vitals taken for this visit.  Physical Exam Constitutional:      Appearance: Normal appearance. She is obese.  HENT:     Mouth/Throat:     Mouth: Mucous membranes are moist.     Pharynx: No oropharyngeal exudate or posterior oropharyngeal erythema.  Eyes:     Extraocular Movements: Extraocular movements intact.     Pupils: Pupils are equal, round, and reactive to light.  Neck:     Musculoskeletal: Normal range of motion and neck supple.  Cardiovascular:     Rate and Rhythm: Normal rate and regular rhythm.  Pulmonary:     Effort: Pulmonary effort is normal.     Breath sounds: Wheezing and rhonchi present.  Abdominal:     General: Bowel sounds are normal. There is no distension.     Palpations: Abdomen is soft.     Tenderness: There is no abdominal tenderness.  Musculoskeletal:        General: No swelling.     Right lower leg: No edema.     Left lower leg: No edema.  Neurological:     General: No  focal deficit present.     Mental Status: She is alert and oriented to person, place, and time.  Psychiatric:        Mood and Affect: Mood normal.     Lab Results No results found for this or any previous visit (from the past 48 hour(s)). No results found for: SDES, SPECREQUEST, CULT, REPTSTATUS No results found. No results found for this or any previous visit (from the past 240 hour(s)).  Microbiology: No results found for this or any previous visit (from the past 240 hour(s)).  Radiographs and labs were personally reviewed by me.   Bobby Rumpf, MD [facility] for [facility] [phone] 04/10/2018, 10:06 AM

## 2018-04-10 NOTE — Consult Note (Addendum)
PULMONARY / CRITICAL CARE MEDICINE   NAME:  Rita Riley, MRN:  [id number], DOB:  1971-01-16, LOS: 0 ADMISSION DATE:  04/10/2018, CONSULTATION DATE: 04/10/2018 REFERRING MD: Infectious disease, CHIEF COMPLAINT: General malaise cough and questionable exposure to coronavirus.  BRIEF HISTORY:    Morbid obese female with history of migraine headaches with new findings of   CT scan with atypical pneumonia questionable sarcoid who was discharged from [facility] 04/08/2018 felt worse and had a coronavirus testing on 3/9 and is presented to [facility] long hospital infectious disease program for further evaluation and treatment.  Note that her husband has been exposed to the coronavirus also.   HISTORY OF PRESENT ILLNESS   48 year old female never smoker is morbidly obese, a recent conference less than 20 people and in [place] at the time of outbreak of coronavirus.  She returned to [place] went to urgent care and on 04/06/2018 she was admitted to [facility].  CT scan revealed thyroid nodule and atypical pneumonia bilaterally questionable sarcoid.  After being treated with antibiotics she was discharged 3/11 on levaquin but  Then noting  increasing shortness of breath and general malaise.  She traveled  by personal operating vehicle on 04/09/2020 [facility] long hospital where she was a direct admit by the infectious disease team to  a negative pressure room on contact and droplet precautions and respiratory isolation.  Pulmonary critical care asked to evaluate.  He has no needed pulmonary critical care at this time she is hemodynamically stable but a consult was performed as a preemptive should she deteriorate  in the near future.  Again currently she is in no acute distress on 2lpm and  vital signs are stable.  SIGNIFICANT PAST MEDICAL HISTORY   Bronchitis Migraine headache for which she takes Cardizem Zantac and Nexium  SIGNIFICANT EVENTS:  Recent exposure in [place] to suspected  coronavirus contaminants STUDIES:   Recent CT at [facility] 04/06/18  demonstratedatypical bilateral pneumonia questionable sarcoidosis thyroid nodule CULTURES:  04/06/2011 coronavirus sent to state>> 04/10/2011 respiratory virus panel>> ANTIBIOTICS: all per ID direction (Dr Johnnye Sima)  04/10/2018 Zithromax 04/10/2018 cefepime 04/10/2018 vancomycin  LINES/TUBES:    CONSULTANTS:  04/10/2018 pulmonary critical care 04/10/2018 infectious disease SUBJECTIVE:  Obese wf  with no  acute distress.  Is awake and alert was wheeled into via personal vehicle in a wheelchair and not requiring any supplemental oxygen or other support on arrival   CONSTITUTIONAL: BP 124/77   Pulse 68   Resp 20   Ht 5\' 5"  (1.651 m)   Wt 128.2 kg   SpO2 95%   BMI 47.03 kg/m   No intake/output data recorded.   02 =  2lpm NP        PHYSICAL EXAM: General: Morbidly obese female who is in no acute distress and is on room air.  Neuro: Grossly intact, moves all extremities follows all commands is awake alert HEENT: No JVD or lymphadenopathy is appreciated, able to palpate thyroid nodule, oropharynx is unremarkable possible mild thrush. Cardiovascular: Heart sounds are regular regular rate and rhythm Lungs: Faint expiratory wheezes noted bilaterally  Abdomen: Morbidly  obese positive bowel sounds soft Musculoskeletal: There is intact Skin: Warm dry mild lower extremity edema   pCXR    04/10/2018 :    I personally reviewed images and agree with radiology impression as follows:   Perihilar and lower lobe infiltrate on the right consistent with pneumonia. My review: R perihilar and  LL post basal segment  as dz looks  identical to reported cxr on 3/9 not avail on epic.    RESOLVED PROBLEM LIST   ASSESSMENT AND PLAN   Cough, low-grade fever, sob.  History of exposure to coronavirus while in [place].  Was seen at urgent care and at [facility] admitted for over 24 hours less than 72h  treated  with antimicrobial therapy discharged at increasing shortness of breath.  Note while at [facility] CT revealed atypical pneumonia and a thyroid nodule.  She does have a history of hypothyroidism. Pulmonary critical care will be available as needed Admit to the hospitalist service PRN albuterol for history of bronchitis when ill Antimicrobial therapy per infectious disease consult Negative pressure room Contact isolation with respiratory isolation  Suspected coronavirus exposure ID is following and managing antibiotics She had coronavirus testing while at [facility] days ago will need to monitor for results of that test  Hypothyroidism new diagnosis of thyroid nodule Continue home Synthroid Further work-up as an outpatient for thyroid nodule  Migraine headaches Continue home medications   SUMMARY OF TODAY'S PLAN:  Pulmonary critical care was consulted in the setting of possible coronavirus exposure CT scan with atypical pneumonia 04/06/18  Acute bronchopna rx directed by ID     Best Practice / Goals of Care / Disposition.   DVT PROPHYLAXIS:   SUP: continue PPI as takes at home  NUTRITION: As tolerated MOBILITY: Out of bed to chair as much as possible  GOALS OF CARE: Full code FAMILY DISCUSSIONS: Patient and husband updated at bedside 04/10/2018 DISPOSITION currently in the stepdown unit ICU in negative pressure room  LABS  Glucose No results for input(s): GLUCAP in the last 168 hours.  BMET No results for input(s): NA, K, CL, CO2, BUN, CREATININE, GLUCOSE in the last 168 hours.  Liver Enzymes No results for input(s): AST, ALT, ALKPHOS, BILITOT, ALBUMIN in the last 168 hours.  Electrolytes No results for input(s): CALCIUM, MG, PHOS in the last 168 hours.  CBC No results for input(s): WBC, HGB, HCT, PLT in the last 168 hours.  ABG No results for input(s): PHART, PCO2ART, PO2ART in the last 168 hours.  Coag's No results for input(s): APTT, INR in the last  168 hours.  Sepsis Markers No results for input(s): LATICACIDVEN, PROCALCITON, O2SATVEN in the last 168 hours.  Cardiac Enzymes No results for input(s): TROPONINI, PROBNP in the last 168 hours.  PAST MEDICAL HISTORY :   She  has no past medical history on file.  History of migraine headaches History of bronchitis  PAST SURGICAL HISTORY:  She  has no past surgical history on file.  Allergies  Allergen Reactions  . Sulfa Antibiotics     No current facility-administered medications on file prior to encounter.    No current outpatient medications on file prior to encounter.    FAMILY HISTORY:   Her family history is not on file.  SOCIAL HISTORY:  She    REVIEW OF SYSTEMS:    10 point review of system taken, please see HPI for positives and negatives.    Richardson Landry Minor ACNP Maryanna Shape PCCM Pager [phone] till 1 pm If no answer page [phone] 04/10/2018, 11:53 AM    Attending note/ PCCM service I have personally reviewed all the data from this patient from [facility] as well as all available data that we have generated here today and discussed her case at length with Dr. Evelina Bucy and Dr. Johnnye Sima.  We do not have available the chest x-ray that was  done at [facility] 4 days prior to admission here but it is described as the same and her gas exchange appears marginally adequate on room air which was the same as the case there.  The most bothersome aspect of the case is that she feels that she is getting worse with persistent fever after initially appearing to respond to broad antibiotics and being discharged on Levaquin and does not have typical findings of lobar pneumonia on a CT scan although it is suggested by her chest x-ray which is going to of course be less accurate making this diagnosis related to overlapping shadows and body habitus.   Therefore prudence would indicate the need to admit her and treat her for bacterial pneumonia as per ID while awaiting  coronavirus screening from the state lab and observing strict isolation in the meantime.   At this point there is no indication for critical care involvement and we will be transferring her to the triad service and be on standby in the event of any clinical deterioration requiring life support of any type.   Christinia Gully, MD Pulmonary and [place] [phone] After 5:30 PM or weekends, use [facility] [phone]

## 2018-04-10 NOTE — Progress Notes (Addendum)
Pharmacy Antibiotic Note  Rita Riley is a 48 y.o. female admitted on 04/10/2018 with pneumonia.  Pharmacy has been consulted for Vancomycin and Cefepime dosing. CC:  Worsening SOB, despite treatment for pneumonia. Recently travel history:  2/25 to [place], 2/26 to [place], 2/28 returned to [place].  Went to Urgent care then to [facility] 3/9-3/11 and received IV abx and was discharged on Levaquin.  Plan:  Azithromycin 500mg  IV q24h per MD  Cefepime 2g IV q8h  Vancomycin 2500 mg IV x1, followed by 750mg  IV q8h.  Goal AUC 400-550.  Expected AUC: 516, SCr used: 0.8 (rounded)  Follow up renal function, culture results, and clinical course.    Height: 5\' 5"  (165.1 cm) Weight: 282 lb 10.1 oz (128.2 kg) IBW/kg (Calculated) : 57  No data recorded.  No results for input(s): WBC, CREATININE, LATICACIDVEN, VANCOTROUGH, VANCOPEAK, VANCORANDOM, GENTTROUGH, GENTPEAK, GENTRANDOM, TOBRATROUGH, TOBRAPEAK, TOBRARND, AMIKACINPEAK, AMIKACINTROU, AMIKACIN in the last 168 hours.  CrCl cannot be calculated (No successful lab value found.).    Allergies  Allergen Reactions  . Sulfa Antibiotics     Antimicrobials this admission: 3/13 Azithromycin >> 3/13 Cefepime >> 3/13 Vancomycin >>  Dose adjustments this admission:   Microbiology results: 3/13 Respiratory virus panel: 3/13 MRSA PCR: negative   Thank you for allowing pharmacy to be a part of this patient's care.  Gretta Arab PharmD, BCPS Pager [phone] 04/10/2018 11:09 AM

## 2018-04-10 NOTE — Progress Notes (Addendum)
Spoke with state lab- sample sent from Rita Riley with [place]- result is negative [phone]

## 2018-04-11 DIAGNOSIS — Z881 Allergy status to other antibiotic agents status: Secondary | ICD-10-CM

## 2018-04-11 LAB — BLOOD GAS, ARTERIAL
Acid-Base Excess: 1 mmol/L (ref 0.0–2.0)
Bicarbonate: 25.2 mmol/L (ref 20.0–28.0)
Drawn by: 514251
FIO2: 28
O2 Content: 2 L/min
O2 Saturation: 95.9 %
PH ART: 7.407 (ref 7.350–7.450)
Patient temperature: 98.7
pCO2 arterial: 40.9 mmHg (ref 32.0–48.0)
pO2, Arterial: 80.1 mmHg — ABNORMAL LOW (ref 83.0–108.0)

## 2018-04-11 LAB — CBC
HEMATOCRIT: 42 % (ref 36.0–46.0)
Hemoglobin: 13.5 g/dL (ref 12.0–15.0)
MCH: 31.6 pg (ref 26.0–34.0)
MCHC: 32.1 g/dL (ref 30.0–36.0)
MCV: 98.4 fL (ref 80.0–100.0)
Platelets: 326 10*3/uL (ref 150–400)
RBC: 4.27 MIL/uL (ref 3.87–5.11)
RDW: 12.3 % (ref 11.5–15.5)
WBC: 7.1 10*3/uL (ref 4.0–10.5)
nRBC: 0 % (ref 0.0–0.2)

## 2018-04-11 LAB — BASIC METABOLIC PANEL
Anion gap: 8 (ref 5–15)
BUN: 5 mg/dL — AB (ref 6–20)
CO2: 23 mmol/L (ref 22–32)
CREATININE: 0.83 mg/dL (ref 0.44–1.00)
Calcium: 8 mg/dL — ABNORMAL LOW (ref 8.9–10.3)
Chloride: 109 mmol/L (ref 98–111)
GFR calc Af Amer: >60 mL/min (ref >60)
GFR calc non Af Amer: >60 mL/min (ref >60)
Glucose, Bld: 95 mg/dL (ref 70–99)
Potassium: 3.4 mmol/L — ABNORMAL LOW (ref 3.5–5.1)
Sodium: 140 mmol/L (ref 135–145)

## 2018-04-11 LAB — HIV ANTIBODY (ROUTINE TESTING W REFLEX): HIV Screen 4th Generation wRfx: NONREACTIVE

## 2018-04-11 LAB — HEPATITIS PANEL, ACUTE
HCV Ab: <0.1 s/co ratio (ref 0.0–0.9)
Hep A IgM: NEGATIVE
Hep B C IgM: NEGATIVE
Hepatitis B Surface Ag: NEGATIVE

## 2018-04-11 NOTE — Progress Notes (Signed)
PROGRESS NOTE    CHIDERA DEARCOS  YSA:[id number] DOB: 1971-01-13 DOA: 04/10/2018 PCP: Robyne Peers, MD    Brief Narrative:  Morbidly obese female who presented with atypical PNA and questionable sarcoid recently discharged for PNA presented with increased sob. COVID-19 noted to be negative  Assessment & Plan:   Active Problems:   Morbid obesity ([place])   CAP (community acquired pneumonia)   Acute respiratory failure with hypoxemia (HCC)   Shortness of breath   1. Atypical PNA 1. Noted on imaging, persaonlly reviewed 2. PCCM had been following, since signed off 3. Appreciate in put by ID. Recommendation for continued abx regimen 4. Afebrile currently 5. Remains o2 dependent 2. Morbid obesity 1. Seems stable at this time 3. Acute hypoxemic respiratory failure 1. Remains O2 dependent on ambulation 2. Cont above abx and wean O2 as tolerated 4. Hypokalemia 1. Medically stable 2. Repeat bmet in AM  DVT prophylaxis: SCD's Code Status: Full Family Communication: Pt in room, family at bedside Disposition Plan: Home when able to wean off O2  Consultants:   PCCM  ID  Procedures:     Antimicrobials: Anti-infectives (From admission, onward)   Start     Dose/Rate Route Frequency Ordered Stop   04/10/18 2200  ceFEPIme (MAXIPIME) 2 g in sodium chloride 0.9 % 100 mL IVPB     2 g 200 mL/hr over 30 Minutes Intravenous Every 8 hours 04/10/18 1135     04/10/18 2200  vancomycin (VANCOCIN) IVPB 750 mg/150 ml premix     750 mg 150 mL/hr over 60 Minutes Intravenous Every 8 hours 04/10/18 1330     04/10/18 1200  azithromycin (ZITHROMAX) 500 mg in sodium chloride 0.9 % 250 mL IVPB     500 mg 250 mL/hr over 60 Minutes Intravenous Every 24 hours 04/10/18 1058     04/10/18 1200  vancomycin (VANCOCIN) 2,500 mg in sodium chloride 0.9 % 500 mL IVPB     2,500 mg 250 mL/hr over 120 Minutes Intravenous  Once 04/10/18 1134 04/10/18 1617   04/10/18 1115  ceFEPIme (MAXIPIME) 2 g in  sodium chloride 0.9 % 100 mL IVPB     2 g 200 mL/hr over 30 Minutes Intravenous  Once 04/10/18 1103 04/10/18 1309       Subjective: Feels better, still sob when ambulating  Objective: Vitals:   04/11/18 0911 04/11/18 1155 04/11/18 1224 04/11/18 1600  BP: 129/71  127/82   Pulse: 71  76   Resp: (!) 22  (!) 24   Temp:  (!) 97.5 F (36.4 C)  98 F (36.7 C)  TempSrc:  Oral  Oral  SpO2: 92%  98%   Weight:      Height:        Intake/Output Summary (Last 24 hours) at 04/11/2018 1837 Last data filed at 04/11/2018 1500 Gross per 24 hour  Intake 1876.8 ml  Output -  Net 1876.8 ml   Filed Weights   04/10/18 1000 04/11/18 0500  Weight: 128.2 kg 127.3 kg    Examination:  General exam: Appears calm and comfortable  Respiratory system: Clear to auscultation. Increased resp effort Cardiovascular system: S1 & S2 heard, RRR Gastrointestinal system: Abdomen is nondistended, soft and nontender. No organomegaly or masses felt. Normal bowel sounds heard. Central nervous system: Alert and oriented. No focal neurological deficits. Extremities: Symmetric 5 x 5 power. Skin: No rashes, lesions  Psychiatry: Judgement and insight appear normal. Mood & affect appropriate.   Data Reviewed: I have personally reviewed following  labs and imaging studies  CBC: Recent Labs  Lab 04/10/18 1145 04/11/18 0859  WBC 6.4 7.1  HGB 13.5 13.5  HCT 43.3 42.0  MCV 99.1 98.4  PLT 328 161   Basic Metabolic Panel: Recent Labs  Lab 04/10/18 1145 04/11/18 0859  NA 140 140  K 3.6 3.4*  CL 106 109  CO2 25 23  GLUCOSE 99 95  BUN <5* 5*  CREATININE 0.74 0.83  CALCIUM 8.2* 8.0*  MG 2.0  --   PHOS 2.3*  --    GFR: Estimated Creatinine Clearance: 110.1 mL/min (by C-G formula based on SCr of 0.83 mg/dL). Liver Function Tests: Recent Labs  Lab 04/10/18 1145  AST 52*  ALT 56*  ALKPHOS 98  BILITOT 0.9  PROT 6.5  ALBUMIN 3.1*   Recent Labs  Lab 04/10/18 1145  LIPASE 44  AMYLASE 53   No  results for input(s): AMMONIA in the last 168 hours. Coagulation Profile: Recent Labs  Lab 04/10/18 1145  INR 0.9   Cardiac Enzymes: No results for input(s): CKTOTAL, CKMB, CKMBINDEX, TROPONINI in the last 168 hours. BNP (last 3 results) No results for input(s): PROBNP in the last 8760 hours. HbA1C: No results for input(s): HGBA1C in the last 72 hours. CBG: No results for input(s): GLUCAP in the last 168 hours. Lipid Profile: No results for input(s): CHOL, HDL, LDLCALC, TRIG, CHOLHDL, LDLDIRECT in the last 72 hours. Thyroid Function Tests: No results for input(s): TSH, T4TOTAL, FREET4, T3FREE, THYROIDAB in the last 72 hours. Anemia Panel: No results for input(s): VITAMINB12, FOLATE, FERRITIN, TIBC, IRON, RETICCTPCT in the last 72 hours. Sepsis Labs: Recent Labs  Lab 04/10/18 1145 04/10/18 1838  PROCALCITON <0.10  --   LATICACIDVEN 1.4 1.3    Recent Results (from the past 240 hour(s))  Respiratory Panel by PCR     Status: None   Collection Time: 04/10/18 10:59 AM  Result Value Ref Range Status   Adenovirus NOT DETECTED NOT DETECTED Final   Coronavirus 229E NOT DETECTED NOT DETECTED Final    Comment: (NOTE) The Coronavirus on the Respiratory Panel, DOES NOT test for the novel  Coronavirus (2019 nCoV)    Coronavirus HKU1 NOT DETECTED NOT DETECTED Final   Coronavirus NL63 NOT DETECTED NOT DETECTED Final   Coronavirus OC43 NOT DETECTED NOT DETECTED Final   Metapneumovirus NOT DETECTED NOT DETECTED Final   Rhinovirus / Enterovirus NOT DETECTED NOT DETECTED Final   Influenza A NOT DETECTED NOT DETECTED Final   Influenza B NOT DETECTED NOT DETECTED Final   Parainfluenza Virus 1 NOT DETECTED NOT DETECTED Final   Parainfluenza Virus 2 NOT DETECTED NOT DETECTED Final   Parainfluenza Virus 3 NOT DETECTED NOT DETECTED Final   Parainfluenza Virus 4 NOT DETECTED NOT DETECTED Final   Respiratory Syncytial Virus NOT DETECTED NOT DETECTED Final   Bordetella pertussis NOT DETECTED  NOT DETECTED Final   Chlamydophila pneumoniae NOT DETECTED NOT DETECTED Final   Mycoplasma pneumoniae NOT DETECTED NOT DETECTED Final    Comment: Performed at [facility], [address], [place], [place] [address]  MRSA PCR Screening     Status: None   Collection Time: 04/10/18 11:20 AM  Result Value Ref Range Status   MRSA by PCR NEGATIVE NEGATIVE Final    Comment:        The GeneXpert MRSA Assay (FDA approved for NASAL specimens only), is one component of a comprehensive MRSA colonization surveillance program. It is not intended to diagnose MRSA infection nor to  guide or monitor treatment for MRSA infections. Performed at [facility], [place] [address] [place], [place] [address]      Radiology Studies: Dg Chest Port 1 View  Result Date: 04/10/2018 CLINICAL DATA:  Shortness of breath EXAM: PORTABLE CHEST 1 VIEW COMPARISON:  None. FINDINGS: Heart size is normal. Perihilar and lower lobe infiltrate on the right consistent with pneumonia. Left lung is clear. No dense consolidation or lobar collapse. No effusion. IMPRESSION: Perihilar and lower lobe infiltrate on the right consistent with pneumonia. Electronically Signed   By: Nelson Chimes M.D.   On: 04/10/2018 11:28    Scheduled Meds: . mouth rinse  15 mL Mouth Rinse BID  . pantoprazole (PROTONIX) IV  40 mg Intravenous QHS   Continuous Infusions: . sodium chloride 50 mL/hr at 04/11/18 1338  . azithromycin Stopped (04/11/18 1239)  . ceFEPime (MAXIPIME) IV 2 g (04/11/18 1348)  . vancomycin 750 mg (04/11/18 1351)     LOS: 1 day   Marylu Lund, MD [facility] Pager On Amion  If 7PM-7AM, please contact night-coverage 04/11/2018, 6:37 PM

## 2018-04-11 NOTE — Progress Notes (Signed)
SATURATION QUALIFICATIONS: (This note is used to comply with regulatory documentation for home oxygen)  Patient Saturations on Room Air at Rest = 96%  Patient Saturations on Room Air while Ambulating = 83%  Patient Saturations on 2 Liters of oxygen while Ambulating = 98%  Please briefly explain why patient needs home oxygen:  Patient unable to maintain oxygen level greater than 92% on room air

## 2018-04-11 NOTE — Progress Notes (Signed)
Patient stable per RN And ID dc'ed isolation  No need for ccm consult    SIGNATURE    Dr. Brand Males, M.D., F.C.C.P,  Pulmonary and Critical Care Medicine Staff Physician, [place] Director - Interstitial Lung Disease  Program  Pulmonary [place] at [place], [place], [address]  Pager: [phone], If no answer or between  15:00h - 7:00h: call [phone] Telephone: [phone]  12:44 PM 04/11/2018

## 2018-04-11 NOTE — Progress Notes (Signed)
Patient ID: Rita Riley, female   DOB: [birth year], 48 y.o.   MRN: [id number]         [facility] for Infectious Disease  Date of Admission:  04/10/2018   Total days of antibiotics 6        Day 2 vancomycin        Day 2 cefepime        Day 2 azithromycin ASSESSMENT: She has pneumonia of unknown cause. Covid-19 has been ruled out to the best of our ability.  She seems to be improving slowly on empiric antibiotics.  I will continue her current regimen through the weekend.  PLAN: 1. Continue current antibiotics 2. Discontinue droplet precautions 3. Please call me for any infectious disease questions this weekend  Active Problems:   Morbid obesity ([place])   CAP (community acquired pneumonia)   Acute respiratory failure with hypoxemia (HCC)   Shortness of breath   Scheduled Meds: . mouth rinse  15 mL Mouth Rinse BID  . pantoprazole (PROTONIX) IV  40 mg Intravenous QHS   Continuous Infusions: . sodium chloride 50 mL/hr at 04/10/18 1236  . azithromycin Stopped (04/10/18 1332)  . ceFEPime (MAXIPIME) IV Stopped (04/11/18 0557)  . vancomycin 150 mL/hr at 04/11/18 0637   PRN Meds:.acetaminophen, albuterol, ondansetron (ZOFRAN) IV   SUBJECTIVE: She recently traveled to [facility] 2/26-28 with her husband but they did not attend the convention where there is currently a cluster of Covid-19 in attendees.  She developed pneumonia upon her return and was admitted to [facility] from 3/9-11.  She was on antibiotics and supplemental oxygen while there.  After discharge home on oral Levaquin she developed worsening shortness of breath leading to admission here yesterday.  All microbiologic studies including Covid-19 testing are negative.  She feels a little bit better today.  Her husband also had a respiratory illness but is over it now.  Review of Systems: Review of Systems  Constitutional: Negative for chills, diaphoresis and fever.  HENT: Negative for congestion and sore throat.    Respiratory: Positive for cough and shortness of breath. Negative for hemoptysis, sputum production and wheezing.   Cardiovascular: Negative for chest pain.    Allergies  Allergen Reactions  . Sulfa Antibiotics     OBJECTIVE: Vitals:   04/11/18 0341 04/11/18 0500 04/11/18 0525 04/11/18 0800  BP:   133/86   Pulse:   79   Resp:   16   Temp: 98.1 F (36.7 C)   97.7 F (36.5 C)  TempSrc: Oral   Oral  SpO2:   97%   Weight:  127.3 kg    Height:       Body mass index is 46.7 kg/m.  Physical Exam Constitutional:      Comments: She is pleasant and in no distress sitting up in bed.  Cardiovascular:     Rate and Rhythm: Normal rate and regular rhythm.     Heart sounds: No murmur.  Pulmonary:     Effort: Pulmonary effort is normal.     Breath sounds: Normal breath sounds. No rhonchi or rales.     Comments: Faint wheeze on her left side. Psychiatric:        Mood and Affect: Mood normal.     Lab Results Lab Results  Component Value Date   WBC 6.4 04/10/2018   HGB 13.5 04/10/2018   HCT 43.3 04/10/2018   MCV 99.1 04/10/2018   PLT 328 04/10/2018    Lab Results  Component Value Date  CREATININE 0.74 04/10/2018   BUN <5 (L) 04/10/2018   NA 140 04/10/2018   K 3.6 04/10/2018   CL 106 04/10/2018   CO2 25 04/10/2018    Lab Results  Component Value Date   ALT 56 (H) 04/10/2018   AST 52 (H) 04/10/2018   ALKPHOS 98 04/10/2018   BILITOT 0.9 04/10/2018     Microbiology: Recent Results (from the past 240 hour(s))  Respiratory Panel by PCR     Status: None   Collection Time: 04/10/18 10:59 AM  Result Value Ref Range Status   Adenovirus NOT DETECTED NOT DETECTED Final   Coronavirus 229E NOT DETECTED NOT DETECTED Final    Comment: (NOTE) The Coronavirus on the Respiratory Panel, DOES NOT test for the novel  Coronavirus (2019 nCoV)    Coronavirus HKU1 NOT DETECTED NOT DETECTED Final   Coronavirus NL63 NOT DETECTED NOT DETECTED Final   Coronavirus OC43 NOT DETECTED NOT  DETECTED Final   Metapneumovirus NOT DETECTED NOT DETECTED Final   Rhinovirus / Enterovirus NOT DETECTED NOT DETECTED Final   Influenza A NOT DETECTED NOT DETECTED Final   Influenza B NOT DETECTED NOT DETECTED Final   Parainfluenza Virus 1 NOT DETECTED NOT DETECTED Final   Parainfluenza Virus 2 NOT DETECTED NOT DETECTED Final   Parainfluenza Virus 3 NOT DETECTED NOT DETECTED Final   Parainfluenza Virus 4 NOT DETECTED NOT DETECTED Final   Respiratory Syncytial Virus NOT DETECTED NOT DETECTED Final   Bordetella pertussis NOT DETECTED NOT DETECTED Final   Chlamydophila pneumoniae NOT DETECTED NOT DETECTED Final   Mycoplasma pneumoniae NOT DETECTED NOT DETECTED Final    Comment: Performed at [facility], [place], [place], [place] [address]  MRSA PCR Screening     Status: None   Collection Time: 04/10/18 11:20 AM  Result Value Ref Range Status   MRSA by PCR NEGATIVE NEGATIVE Final    Comment:        The GeneXpert MRSA Assay (FDA approved for NASAL specimens only), is one component of a comprehensive MRSA colonization surveillance program. It is not intended to diagnose MRSA infection nor to guide or monitor treatment for MRSA infections. Performed at [facility], [place] [address] [place], [place] [address]     Michel Bickers, [place] for [facility] [place] [facility] [phone] pager   [phone] cell 04/11/2018, 9:25 AM

## 2018-04-12 LAB — BASIC METABOLIC PANEL
Anion gap: 10 (ref 5–15)
BUN: 5 mg/dL — ABNORMAL LOW (ref 6–20)
CO2: 18 mmol/L — ABNORMAL LOW (ref 22–32)
Calcium: 8 mg/dL — ABNORMAL LOW (ref 8.9–10.3)
Chloride: 111 mmol/L (ref 98–111)
Creatinine, Ser: 0.8 mg/dL (ref 0.44–1.00)
Glucose, Bld: 95 mg/dL (ref 70–99)
Potassium: 3.8 mmol/L (ref 3.5–5.1)
Sodium: 139 mmol/L (ref 135–145)

## 2018-04-12 LAB — LEGIONELLA PNEUMOPHILA SEROGP 1 UR AG: L. pneumophila Serogp 1 Ur Ag: NEGATIVE

## 2018-04-12 LAB — URINE CULTURE: Culture: NO GROWTH

## 2018-04-12 MED ORDER — GUAIFENESIN ER 600 MG PO TB12
600.0000 mg | ORAL_TABLET | Freq: Two times a day (BID) | ORAL | Status: DC
  Administered 2018-04-12 – 2018-04-13 (×3): 600 mg via ORAL
  Filled 2018-04-12 (×3): qty 1

## 2018-04-12 NOTE — Progress Notes (Signed)
Pt ambulated approximately 300 feet in the hallway this afternoon on room air. O2 sats dropped as low as 94% but generally stayed at 96% during ambulation. HR got as high as 107 but decreased into the 80s before returning to the room.

## 2018-04-12 NOTE — Progress Notes (Signed)
PROGRESS NOTE    Rita Riley  POE:[id number] DOB: Dec 15, 1970 DOA: 04/10/2018 PCP: Robyne Peers, MD    Brief Narrative:  Morbidly obese female who presented with atypical PNA and questionable sarcoid recently discharged for PNA presented with increased sob. COVID-19 noted to be negative  Assessment & Plan:   Active Problems:   Morbid obesity ([place])   CAP (community acquired pneumonia)   Acute respiratory failure with hypoxemia (HCC)   Shortness of breath   1. Atypical PNA 1. Noted on imaging, persaonlly reviewed 2. PCCM had been following, since signed off 3. Appreciate in put by ID. 4. Afebrile currently 5. Remains o2 dependent, continue to wean as tolerated 6. Per ID, recommendation to stop abx after today's dose. ID since signed off 2. Morbid obesity 1. Seems stable currently 3. Acute hypoxemic respiratory failure 1. Remains O2 dependent on ambulation 2. Cont above abx and wean O2 as tolerated 4. Hypokalemia 1. Medically stable 2. Resolved 3. Repeat bmet in AM  DVT prophylaxis: SCD's Code Status: Full Family Communication: Pt in room, family at bedside Disposition Plan: Home when able to wean off O2  Consultants:   PCCM  ID  Procedures:     Antimicrobials: Anti-infectives (From admission, onward)   Start     Dose/Rate Route Frequency Ordered Stop   04/10/18 2200  ceFEPIme (MAXIPIME) 2 g in sodium chloride 0.9 % 100 mL IVPB     2 g 200 mL/hr over 30 Minutes Intravenous Every 8 hours 04/10/18 1135     04/10/18 2200  vancomycin (VANCOCIN) IVPB 750 mg/150 ml premix     750 mg 150 mL/hr over 60 Minutes Intravenous Every 8 hours 04/10/18 1330     04/10/18 1200  azithromycin (ZITHROMAX) 500 mg in sodium chloride 0.9 % 250 mL IVPB     500 mg 250 mL/hr over 60 Minutes Intravenous Every 24 hours 04/10/18 1058     04/10/18 1200  vancomycin (VANCOCIN) 2,500 mg in sodium chloride 0.9 % 500 mL IVPB     2,500 mg 250 mL/hr over 120 Minutes Intravenous  Once  04/10/18 1134 04/10/18 1617   04/10/18 1115  ceFEPIme (MAXIPIME) 2 g in sodium chloride 0.9 % 100 mL IVPB     2 g 200 mL/hr over 30 Minutes Intravenous  Once 04/10/18 1103 04/10/18 1309      Subjective: Reports feeling well at rest, however sob when attempting to ambulate  Objective: Vitals:   04/12/18 0500 04/12/18 0535 04/12/18 0616 04/12/18 1431  BP:  121/63 115/70 128/83  Pulse:  76 71 72  Resp:  (!) 26 18 16   Temp:   97.8 F (36.6 C) 98.8 F (37.1 C)  TempSrc:    Oral  SpO2:  94% 96% 97%  Weight: 130 kg     Height:        Intake/Output Summary (Last 24 hours) at 04/12/2018 1709 Last data filed at 04/12/2018 1054 Gross per 24 hour  Intake 1179.04 ml  Output -  Net 1179.04 ml   Filed Weights   04/10/18 1000 04/11/18 0500 04/12/18 0500  Weight: 128.2 kg 127.3 kg 130 kg    Examination: General exam: Awake, laying in bed, in nad Respiratory system: Normal respiratory effort, end-expiratory wheezing Cardiovascular system: regular rate, s1, s2 Gastrointestinal system: Soft, nondistended, positive BS Central nervous system: CN2-12 grossly intact, strength intact Extremities: Perfused, no clubbing Skin: Normal skin turgor, no notable skin lesions seen Psychiatry: Mood normal // no visual hallucinations   Data Reviewed:  I have personally reviewed following labs and imaging studies  CBC: Recent Labs  Lab 04/10/18 1145 04/11/18 0859  WBC 6.4 7.1  HGB 13.5 13.5  HCT 43.3 42.0  MCV 99.1 98.4  PLT 328 272   Basic Metabolic Panel: Recent Labs  Lab 04/10/18 1145 04/11/18 0859 04/12/18 0703  NA 140 140 139  K 3.6 3.4* 3.8  CL 106 109 111  CO2 25 23 18*  GLUCOSE 99 95 95  BUN <5* 5* 5*  CREATININE 0.74 0.83 0.80  CALCIUM 8.2* 8.0* 8.0*  MG 2.0  --   --   PHOS 2.3*  --   --    GFR: Estimated Creatinine Clearance: 115.8 mL/min (by C-G formula based on SCr of 0.8 mg/dL). Liver Function Tests: Recent Labs  Lab 04/10/18 1145  AST 52*  ALT 56*  ALKPHOS  98  BILITOT 0.9  PROT 6.5  ALBUMIN 3.1*   Recent Labs  Lab 04/10/18 1145  LIPASE 44  AMYLASE 53   No results for input(s): AMMONIA in the last 168 hours. Coagulation Profile: Recent Labs  Lab 04/10/18 1145  INR 0.9   Cardiac Enzymes: No results for input(s): CKTOTAL, CKMB, CKMBINDEX, TROPONINI in the last 168 hours. BNP (last 3 results) No results for input(s): PROBNP in the last 8760 hours. HbA1C: No results for input(s): HGBA1C in the last 72 hours. CBG: No results for input(s): GLUCAP in the last 168 hours. Lipid Profile: No results for input(s): CHOL, HDL, LDLCALC, TRIG, CHOLHDL, LDLDIRECT in the last 72 hours. Thyroid Function Tests: No results for input(s): TSH, T4TOTAL, FREET4, T3FREE, THYROIDAB in the last 72 hours. Anemia Panel: No results for input(s): VITAMINB12, FOLATE, FERRITIN, TIBC, IRON, RETICCTPCT in the last 72 hours. Sepsis Labs: Recent Labs  Lab 04/10/18 1145 04/10/18 1838  PROCALCITON <0.10  --   LATICACIDVEN 1.4 1.3    Recent Results (from the past 240 hour(s))  Respiratory Panel by PCR     Status: None   Collection Time: 04/10/18 10:59 AM  Result Value Ref Range Status   Adenovirus NOT DETECTED NOT DETECTED Final   Coronavirus 229E NOT DETECTED NOT DETECTED Final    Comment: (NOTE) The Coronavirus on the Respiratory Panel, DOES NOT test for the novel  Coronavirus (2019 nCoV)    Coronavirus HKU1 NOT DETECTED NOT DETECTED Final   Coronavirus NL63 NOT DETECTED NOT DETECTED Final   Coronavirus OC43 NOT DETECTED NOT DETECTED Final   Metapneumovirus NOT DETECTED NOT DETECTED Final   Rhinovirus / Enterovirus NOT DETECTED NOT DETECTED Final   Influenza A NOT DETECTED NOT DETECTED Final   Influenza B NOT DETECTED NOT DETECTED Final   Parainfluenza Virus 1 NOT DETECTED NOT DETECTED Final   Parainfluenza Virus 2 NOT DETECTED NOT DETECTED Final   Parainfluenza Virus 3 NOT DETECTED NOT DETECTED Final   Parainfluenza Virus 4 NOT DETECTED NOT  DETECTED Final   Respiratory Syncytial Virus NOT DETECTED NOT DETECTED Final   Bordetella pertussis NOT DETECTED NOT DETECTED Final   Chlamydophila pneumoniae NOT DETECTED NOT DETECTED Final   Mycoplasma pneumoniae NOT DETECTED NOT DETECTED Final    Comment: Performed at [facility], [address], [place], [place] [address]  MRSA PCR Screening     Status: None   Collection Time: 04/10/18 11:20 AM  Result Value Ref Range Status   MRSA by PCR NEGATIVE NEGATIVE Final    Comment:        The GeneXpert MRSA Assay (FDA approved for NASAL specimens  only), is one component of a comprehensive MRSA colonization surveillance program. It is not intended to diagnose MRSA infection nor to guide or monitor treatment for MRSA infections. Performed at [facility], [place] [address] [place], [place] [address]   Urine culture     Status: None   Collection Time: 04/10/18  2:10 PM  Result Value Ref Range Status   Specimen Description   Final    URINE, CLEAN CATCH Performed at [facility], [place] [address] [place], [place] [address]    Special Requests   Final    NONE Performed at [facility], [place] [address] [place], [place] [address]    Culture   Final    NO GROWTH Performed at [facility], [place] [address] [place], [place] [address]    Report Status 04/12/2018 FINAL  Final  Culture, blood (routine x 2)     Status: None (Preliminary result)   Collection Time: 04/10/18  6:38 PM  Result Value Ref Range Status   Specimen Description   Final    BLOOD RIGHT ANTECUBITAL Performed at [place] [address], [place], [place] [address]    Special Requests   Final    BOTTLES DRAWN AEROBIC AND ANAEROBIC Blood Culture adequate volume Performed at [place] [address] [place], [place] [address]    Culture   Final    NO GROWTH 1 DAY Performed at [facility], [place]  [address] [place], [place] [address]    Report Status PENDING  Incomplete  Culture, blood (routine x 2)     Status: None (Preliminary result)   Collection Time: 04/10/18  7:00 PM  Result Value Ref Range Status   Specimen Description   Final    BLOOD LEFT ANTECUBITAL Performed at [place] [address], [place], [place] [address]    Special Requests   Final    BOTTLES DRAWN AEROBIC AND ANAEROBIC Blood Culture adequate volume Performed at [place] [address], [place], [place] [address]    Culture   Final    NO GROWTH 1 DAY Performed at [facility], [place] [address], [place], [place] [address]    Report Status PENDING  Incomplete     Radiology Studies: No results found.  Scheduled Meds: . guaiFENesin  600 mg Oral BID  . mouth rinse  15 mL Mouth Rinse BID  . pantoprazole (PROTONIX) IV  40 mg Intravenous QHS   Continuous Infusions: . sodium chloride 50 mL/hr at 04/12/18 1226  . azithromycin 500 mg (04/12/18 1230)  . ceFEPime (MAXIPIME) IV 2 g (04/12/18 1542)  . vancomycin 750 mg (04/12/18 1639)     LOS: 2 days   Marylu Lund, MD [facility] Pager On Amion  If 7PM-7AM, please contact night-coverage 04/12/2018, 5:09 PM

## 2018-04-12 NOTE — Progress Notes (Signed)
Patient ID: Rita Riley, female   DOB: 25-Jun-1970, 48 y.o.   MRN: [id number]          [facility]    Date of Admission:  04/10/2018   Total days of antibiotics 7         She is now afebrile in room air O2 sats are good.  All microbiologic work-up is negative including Covid-19.  I would stop all antibiotics after today's doses.  The plan is for her to discharge home when she can ambulate without supplemental oxygen.  I will sign off now.         Michel Bickers, MD [facility] [place] [facility] [phone] pager   [phone] cell 04/12/2018, 11:01 AM

## 2018-04-13 MED ORDER — PANTOPRAZOLE SODIUM 40 MG PO TBEC: 40.0000 mg | DELAYED_RELEASE_TABLET | Freq: Every day | ORAL | Status: DC

## 2018-04-13 NOTE — Discharge Summary (Signed)
Physician Discharge Summary  Rita Riley EUM:[id number] DOB: October 26, 1970 DOA: 04/10/2018  PCP: Robyne Peers, MD  Admit date: 04/10/2018 Discharge date: 04/13/2018  Admitted From: Home Disposition:  Home  Recommendations for Outpatient Follow-up:  1. Follow up with PCP in 1-2 weeks 2. Recommend follow up with Pulmonary as scheduled 3. Recommend repeat CXR in 1-2 weeks to ensure resolution  Discharge Condition:Improved CODE STATUS:Full Diet recommendation: Regular   Brief/Interim Summary: Morbidly obese female who presented with atypical PNA and questionable sarcoid recently discharged for PNA presented with increased sob. COVID-19 noted to be negative  Discharge Diagnoses:  Active Problems:   Morbid obesity ([place])   CAP (community acquired pneumonia)   Acute respiratory failure with hypoxemia (HCC)   Shortness of breath   1. Atypical PNA 1. Noted on imaging, persaonlly reviewed 2. PCCM had been following, since signed off 3. Appreciate in put by ID. 4. Afebrile currently 5. Remains o2 dependent, continue to wean as tolerated 6. Per ID, recommendation to stop abx after 3/15 dose. ID since signed off 7. Able to ambulate on RA 2. Morbid obesity 1. Seems stable currently 3. Acute hypoxemic respiratory failure 1. Remains O2 dependent on ambulation 2. Completed abx per above, weaned to RA 4. Hypokalemia 1. Medically stable 2. Resolved 3. Repeat bmet in AM   Discharge Instructions   Allergies as of 04/13/2018      Reactions   Sulfa Antibiotics       Medication List    TAKE these medications   esomeprazole 40 MG capsule Commonly known as:  NEXIUM Take 40 mg by mouth daily at 12 noon.   gabapentin 300 MG capsule Commonly known as:  NEURONTIN Take 300 mg by mouth at bedtime.   levothyroxine 175 MCG tablet Commonly known as:  SYNTHROID, LEVOTHROID Take 175 mcg by mouth daily before breakfast.   PRESCRIPTION MEDICATION Apply 1 application topically as  needed (Rash). Prescription cream for rash   ranitidine 150 MG capsule Commonly known as:  ZANTAC Take 150 mg by mouth daily.   verapamil 240 MG CR tablet Commonly known as:  CALAN-SR Take 240 mg by mouth 2 (two) times daily.       Allergies  Allergen Reactions  . Sulfa Antibiotics     Consultations:  PCCM ID  Procedures/Studies: Dg Chest Port 1 View  Result Date: 04/10/2018 CLINICAL DATA:  Shortness of breath EXAM: PORTABLE CHEST 1 VIEW COMPARISON:  None. FINDINGS: Heart size is normal. Perihilar and lower lobe infiltrate on the right consistent with pneumonia. Left lung is clear. No dense consolidation or lobar collapse. No effusion. IMPRESSION: Perihilar and lower lobe infiltrate on the right consistent with pneumonia. Electronically Signed   By: Nelson Chimes M.D.   On: 04/10/2018 11:28    Subjective: Eager to go home  Discharge Exam: Vitals:   04/13/18 1057 04/13/18 1058  BP:    Pulse: 90   Resp:    Temp:    SpO2: 96% 94%   Vitals:   04/12/18 2024 04/13/18 0450 04/13/18 1057 04/13/18 1058  BP: 125/64 125/72    Pulse: 69 73 90   Resp: 18 18    Temp: 98.7 F (37.1 C) 98.5 F (36.9 C)    TempSrc: Oral Oral    SpO2: 97% 93% 96% 94%  Weight:      Height:        General: Pt is alert, awake, not in acute distress Cardiovascular: RRR, S1/S2 +, no rubs, no gallops Respiratory: CTA bilaterally,  no wheezing, no rhonchi Abdominal: Soft, NT, ND, bowel sounds + Extremities: no edema, no cyanosis   The results of significant diagnostics from this hospitalization (including imaging, microbiology, ancillary and laboratory) are listed below for reference.     Microbiology: Recent Results (from the past 240 hour(s))  Respiratory Panel by PCR     Status: None   Collection Time: 04/10/18 10:59 AM  Result Value Ref Range Status   Adenovirus NOT DETECTED NOT DETECTED Final   Coronavirus 229E NOT DETECTED NOT DETECTED Final    Comment: (NOTE) The Coronavirus on  the Respiratory Panel, DOES NOT test for the novel  Coronavirus (2019 nCoV)    Coronavirus HKU1 NOT DETECTED NOT DETECTED Final   Coronavirus NL63 NOT DETECTED NOT DETECTED Final   Coronavirus OC43 NOT DETECTED NOT DETECTED Final   Metapneumovirus NOT DETECTED NOT DETECTED Final   Rhinovirus / Enterovirus NOT DETECTED NOT DETECTED Final   Influenza A NOT DETECTED NOT DETECTED Final   Influenza B NOT DETECTED NOT DETECTED Final   Parainfluenza Virus 1 NOT DETECTED NOT DETECTED Final   Parainfluenza Virus 2 NOT DETECTED NOT DETECTED Final   Parainfluenza Virus 3 NOT DETECTED NOT DETECTED Final   Parainfluenza Virus 4 NOT DETECTED NOT DETECTED Final   Respiratory Syncytial Virus NOT DETECTED NOT DETECTED Final   Bordetella pertussis NOT DETECTED NOT DETECTED Final   Chlamydophila pneumoniae NOT DETECTED NOT DETECTED Final   Mycoplasma pneumoniae NOT DETECTED NOT DETECTED Final    Comment: Performed at [facility], [address] [place], [place] [address]  MRSA PCR Screening     Status: None   Collection Time: 04/10/18 11:20 AM  Result Value Ref Range Status   MRSA by PCR NEGATIVE NEGATIVE Final    Comment:        The GeneXpert MRSA Assay (FDA approved for NASAL specimens only), is one component of a comprehensive MRSA colonization surveillance program. It is not intended to diagnose MRSA infection nor to guide or monitor treatment for MRSA infections. Performed at [facility], [place] [address], [place], [place] [address]   Urine culture     Status: None   Collection Time: 04/10/18  2:10 PM  Result Value Ref Range Status   Specimen Description   Final    URINE, CLEAN CATCH Performed at [facility], [place] [address] [place], [place] [address]    Special Requests   Final    NONE Performed at [facility], [place] [address], [place], [place] [address]    Culture   Final    NO GROWTH Performed at [place], [place] [address] [place], [place] [address]    Report Status 04/12/2018 FINAL  Final  Culture, blood (routine x 2)     Status: None (Preliminary result)   Collection Time: 04/10/18  6:38 PM  Result Value Ref Range Status   Specimen Description   Final    BLOOD RIGHT ANTECUBITAL Performed at [place] [address], [place], [place] [address]    Special Requests   Final    BOTTLES DRAWN AEROBIC AND ANAEROBIC Blood Culture adequate volume Performed at [place] [address], [place], [place] [address]    Culture   Final    NO GROWTH 2 DAYS Performed at [place] [address], [place], [place] [address]    Report Status PENDING  Incomplete  Culture, blood (routine x 2)  Status: None (Preliminary result)   Collection Time: 04/10/18  7:00 PM  Result Value Ref Range Status   Specimen Description   Final    BLOOD LEFT ANTECUBITAL Performed at [place] [address], [place], [place] [address]    Special Requests   Final    BOTTLES DRAWN AEROBIC AND ANAEROBIC Blood Culture adequate volume Performed at [place] [address] [place], [place] [address]    Culture   Final    NO GROWTH 2 DAYS Performed at [place] [address] [place], [place] [address]    Report Status PENDING  Incomplete     Labs: BNP (last 3 results) No results for input(s): BNP in the last 8760 hours. Basic Metabolic Panel: Recent Labs  Lab 04/10/18 1145 04/11/18 0859 04/12/18 0703  NA 140 140 139  K 3.6 3.4* 3.8  CL 106 109 111  CO2 25 23 18*  GLUCOSE 99 95 95  BUN <5* 5* 5*  CREATININE 0.74 0.83 0.80  CALCIUM 8.2* 8.0* 8.0*  MG 2.0  --   --   PHOS 2.3*  --   --    Liver Function Tests: Recent Labs  Lab 04/10/18 1145  AST 52*  ALT 56*  ALKPHOS 98  BILITOT 0.9  PROT 6.5  ALBUMIN 3.1*   Recent Labs  Lab 04/10/18 1145  LIPASE 44  AMYLASE 53   No results for  input(s): AMMONIA in the last 168 hours. CBC: Recent Labs  Lab 04/10/18 1145 04/11/18 0859  WBC 6.4 7.1  HGB 13.5 13.5  HCT 43.3 42.0  MCV 99.1 98.4  PLT 328 326   Cardiac Enzymes: No results for input(s): CKTOTAL, CKMB, CKMBINDEX, TROPONINI in the last 168 hours. BNP: Invalid input(s): POCBNP CBG: No results for input(s): GLUCAP in the last 168 hours. D-Dimer No results for input(s): DDIMER in the last 72 hours. Hgb A1c No results for input(s): HGBA1C in the last 72 hours. Lipid Profile No results for input(s): CHOL, HDL, LDLCALC, TRIG, CHOLHDL, LDLDIRECT in the last 72 hours. Thyroid function studies No results for input(s): TSH, T4TOTAL, T3FREE, THYROIDAB in the last 72 hours.  Invalid input(s): FREET3 Anemia work up No results for input(s): VITAMINB12, FOLATE, FERRITIN, TIBC, IRON, RETICCTPCT in the last 72 hours. Urinalysis No results found for: COLORURINE, APPEARANCEUR, [place], [place], GLUCOSEU, [place], [place], [place], PROTEINUR, UROBILINOGEN, NITRITE, LEUKOCYTESUR Sepsis Labs Invalid input(s): PROCALCITONIN,  WBC,  LACTICIDVEN Microbiology Recent Results (from the past 240 hour(s))  Respiratory Panel by PCR     Status: None   Collection Time: 04/10/18 10:59 AM  Result Value Ref Range Status   Adenovirus NOT DETECTED NOT DETECTED Final   Coronavirus 229E NOT DETECTED NOT DETECTED Final    Comment: (NOTE) The Coronavirus on the Respiratory Panel, DOES NOT test for the novel  Coronavirus (2019 nCoV)    Coronavirus HKU1 NOT DETECTED NOT DETECTED Final   Coronavirus NL63 NOT DETECTED NOT DETECTED Final   Coronavirus OC43 NOT DETECTED NOT DETECTED Final   Metapneumovirus NOT DETECTED NOT DETECTED Final   Rhinovirus / Enterovirus NOT DETECTED NOT DETECTED Final   Influenza A NOT DETECTED NOT DETECTED Final   Influenza B NOT DETECTED NOT DETECTED Final   Parainfluenza Virus 1 NOT DETECTED NOT DETECTED Final   Parainfluenza Virus 2 NOT DETECTED NOT DETECTED  Final   Parainfluenza Virus 3 NOT DETECTED NOT DETECTED Final   Parainfluenza Virus 4 NOT DETECTED NOT DETECTED Final  Respiratory Syncytial Virus NOT DETECTED NOT DETECTED Final   Bordetella pertussis NOT DETECTED NOT DETECTED Final   Chlamydophila pneumoniae NOT DETECTED NOT DETECTED Final   Mycoplasma pneumoniae NOT DETECTED NOT DETECTED Final    Comment: Performed at [facility], [place] [address], [place], [place] [address]  MRSA PCR Screening     Status: None   Collection Time: 04/10/18 11:20 AM  Result Value Ref Range Status   MRSA by PCR NEGATIVE NEGATIVE Final    Comment:        The GeneXpert MRSA Assay (FDA approved for NASAL specimens only), is one component of a comprehensive MRSA colonization surveillance program. It is not intended to diagnose MRSA infection nor to guide or monitor treatment for MRSA infections. Performed at [facility], [place] [address] [place], [place] [address]   Urine culture     Status: None   Collection Time: 04/10/18  2:10 PM  Result Value Ref Range Status   Specimen Description   Final    URINE, CLEAN CATCH Performed at [facility], [place] [address], [place], [place] [address]    Special Requests   Final    NONE Performed at [facility], [place] [address], [place], [place] [address]    Culture   Final    NO GROWTH Performed at [facility], [place] [address], [place], [place] [address]    Report Status 04/12/2018 FINAL  Final  Culture, blood (routine x 2)     Status: None (Preliminary result)   Collection Time: 04/10/18  6:38 PM  Result Value Ref Range Status   Specimen Description   Final    BLOOD RIGHT ANTECUBITAL Performed at [place] [address] [place], [place] [address]    Special Requests   Final    BOTTLES DRAWN AEROBIC AND ANAEROBIC Blood Culture adequate volume Performed at [place] [address]  [place], [place] [address]    Culture   Final    NO GROWTH 2 DAYS Performed at [place] [address] [place], [place] [address]    Report Status PENDING  Incomplete  Culture, blood (routine x 2)     Status: None (Preliminary result)   Collection Time: 04/10/18  7:00 PM  Result Value Ref Range Status   Specimen Description   Final    BLOOD LEFT ANTECUBITAL Performed at [place] [address] [place], [place] [address]    Special Requests   Final    BOTTLES DRAWN AEROBIC AND ANAEROBIC Blood Culture adequate volume Performed at [place] [address] [place], [place] [address]    Culture   Final    NO GROWTH 2 DAYS Performed at [place] [address] [place], [place] [address]    Report Status PENDING  Incomplete   Time spent: 30 min  SIGNED:   Marylu Lund, MD  [facility] 04/13/2018, 12:03 PM  If 7PM-7AM, please contact night-coverage

## 2018-04-13 NOTE — Progress Notes (Signed)
Patient remains stable. Pt is anxious to leave.Discharge instruction reviewed, questions, concerns denied.

## 2018-04-13 NOTE — Progress Notes (Signed)
Patient ambulated 90 feet. Activity tolerated well, gait steady. Oxygen saturation on room air maintained 94-95%. Pt denies any SOB & or weakness at this time.

## 2018-04-14 ENCOUNTER — Encounter: Payer: Self-pay | Admitting: Pediatrics

## 2018-04-16 LAB — CULTURE, BLOOD (ROUTINE X 2)
CULTURE: NO GROWTH
Culture: NO GROWTH
Special Requests: ADEQUATE
Special Requests: ADEQUATE

## 2018-09-11 DIAGNOSIS — I270 Primary pulmonary hypertension: Secondary | ICD-10-CM | POA: Diagnosis not present

## 2020-05-17 ENCOUNTER — Other Ambulatory Visit (HOSPITAL_COMMUNITY): Payer: Self-pay
